# Patient Record
Sex: Male | Born: 1965 | Race: White | Hispanic: No | Marital: Married | Smoking: Former smoker
Health system: Southern US, Community
[De-identification: ages and names within clinical notes are randomized; demographics above are authoritative.]

## PROBLEM LIST (undated history)

## (undated) DIAGNOSIS — E785 Hyperlipidemia, unspecified: Secondary | ICD-10-CM

## (undated) DIAGNOSIS — I10 Essential (primary) hypertension: Secondary | ICD-10-CM

## (undated) DIAGNOSIS — G4733 Obstructive sleep apnea (adult) (pediatric): Secondary | ICD-10-CM

## (undated) DIAGNOSIS — R7989 Other specified abnormal findings of blood chemistry: Secondary | ICD-10-CM

## (undated) DIAGNOSIS — Z9989 Dependence on other enabling machines and devices: Secondary | ICD-10-CM

## (undated) HISTORY — DX: Obstructive sleep apnea (adult) (pediatric): G47.33

## (undated) HISTORY — DX: Other specified abnormal findings of blood chemistry: R79.89

## (undated) HISTORY — DX: Essential (primary) hypertension: I10

## (undated) HISTORY — DX: Hyperlipidemia, unspecified: E78.5

## (undated) HISTORY — DX: Obstructive sleep apnea (adult) (pediatric): Z99.89

---

## 2016-11-01 ENCOUNTER — Other Ambulatory Visit: Payer: Self-pay | Admitting: Family Medicine

## 2016-11-01 DIAGNOSIS — R945 Abnormal results of liver function studies: Principal | ICD-10-CM

## 2016-11-01 DIAGNOSIS — R7989 Other specified abnormal findings of blood chemistry: Secondary | ICD-10-CM

## 2016-11-10 ENCOUNTER — Ambulatory Visit
Admission: RE | Admit: 2016-11-10 | Discharge: 2016-11-10 | Disposition: A | Discharge status: Home / Self Care | Payer: 59 | Source: Ambulatory Visit | Attending: Family Medicine | Admitting: Family Medicine

## 2016-11-10 DIAGNOSIS — R7989 Other specified abnormal findings of blood chemistry: Secondary | ICD-10-CM

## 2016-11-10 DIAGNOSIS — R945 Abnormal results of liver function studies: Principal | ICD-10-CM

## 2017-11-27 IMAGING — US US ABDOMEN COMPLETE
1 series · 14 of 25 positions shown · non-contrast
Comparison: None.

CLINICAL DATA: Elevated LFTs

EXAM:
ABDOMEN ULTRASOUND COMPLETE

[Series 1: us abdomen complete · 0.22mm/px · 14 of 85 slices shown]
[im 1/85]
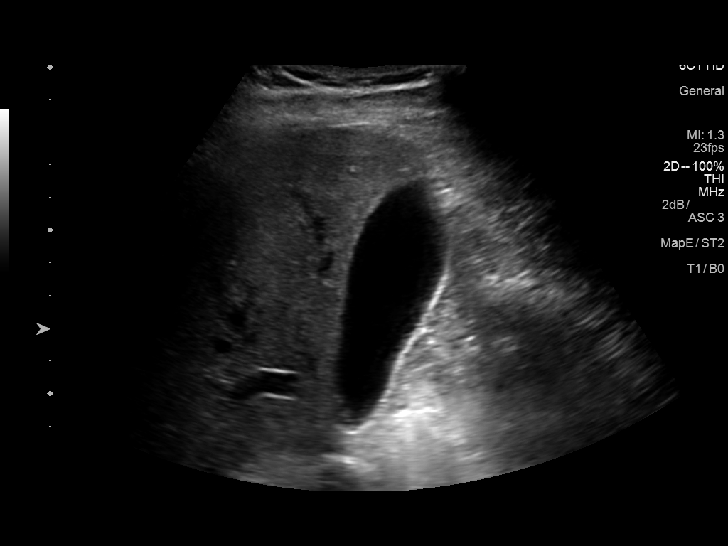
[im 8/85]
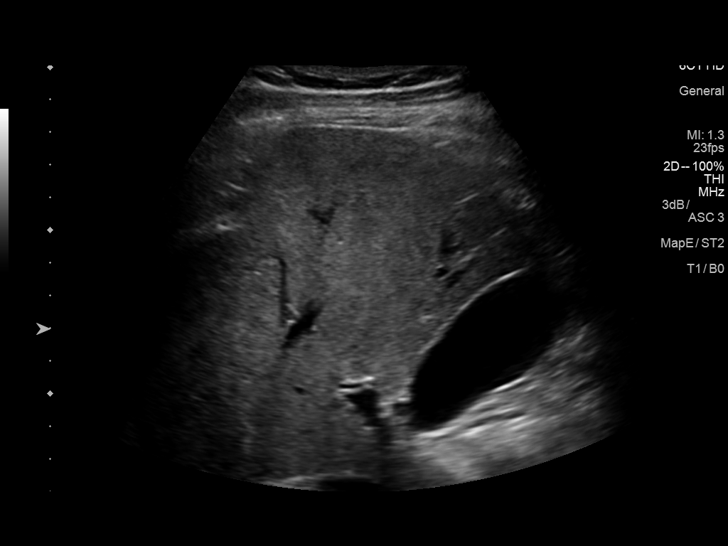
[im 15/85]
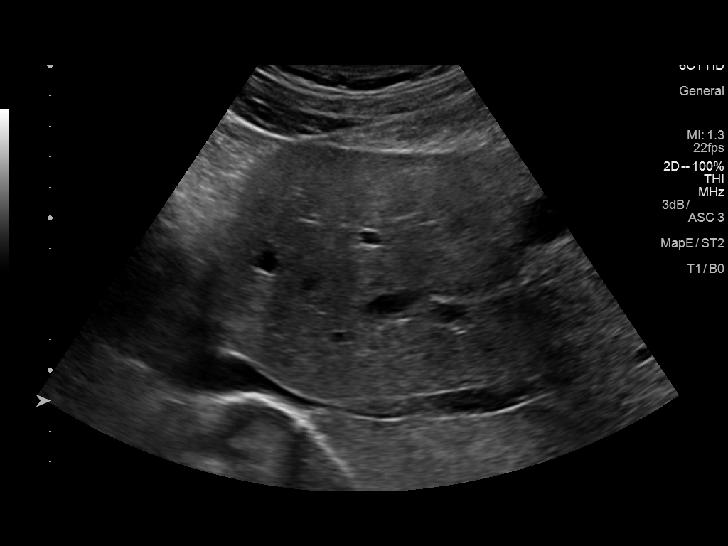
[im 22/85]
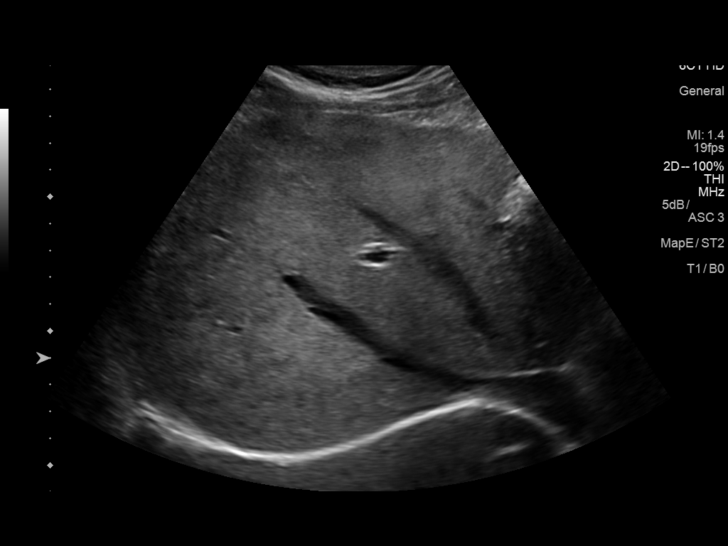
[im 29/85]
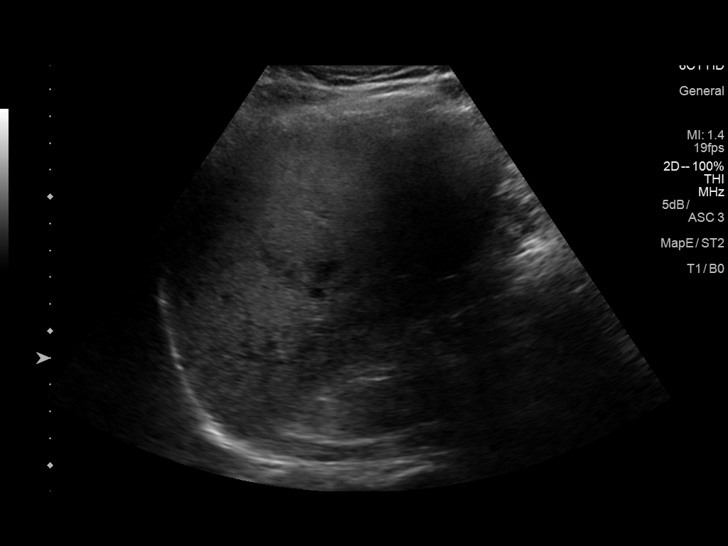
[im 32/85]
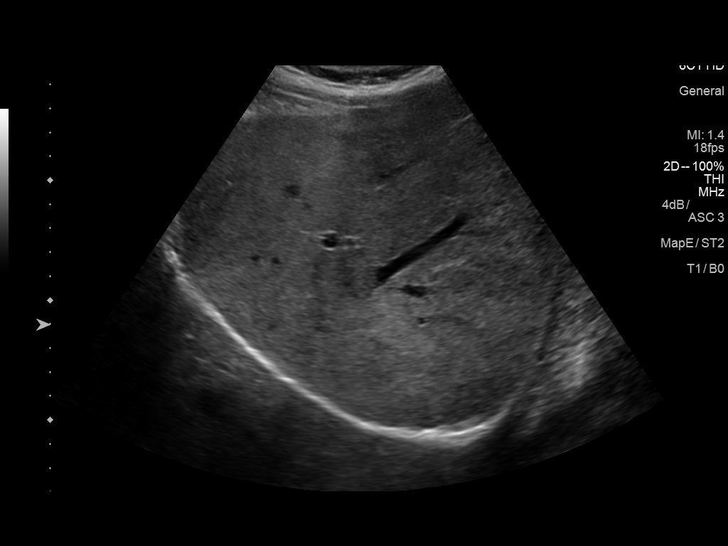
[im 39/85]
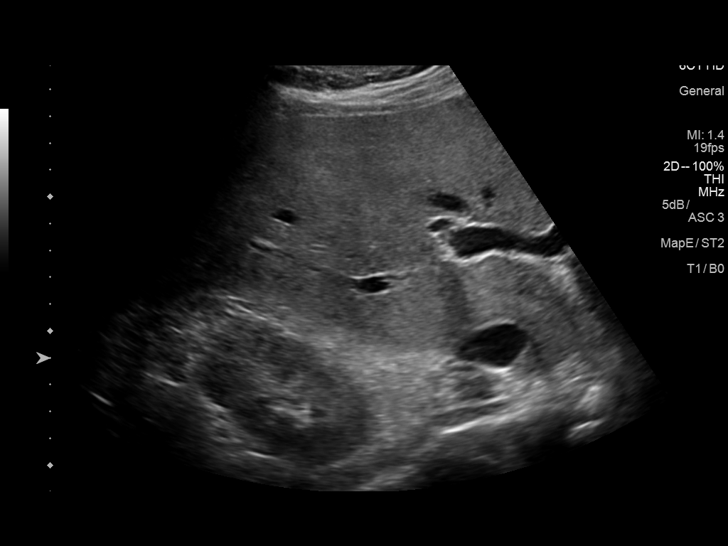
[im 46/85]
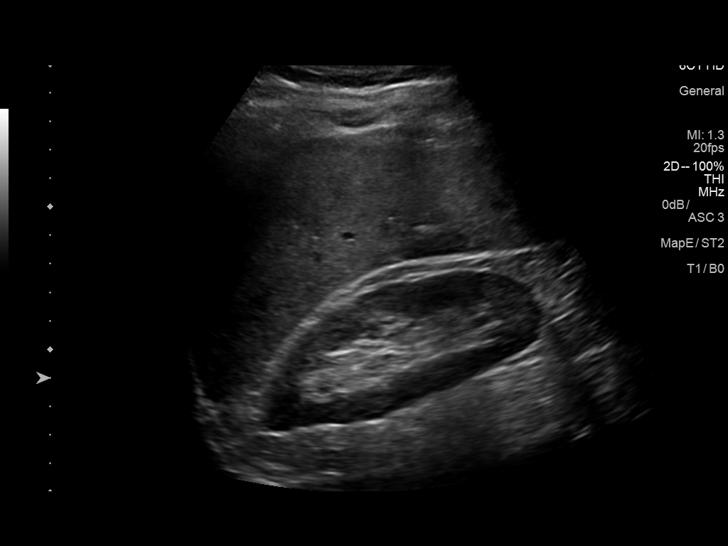
[im 53/85]
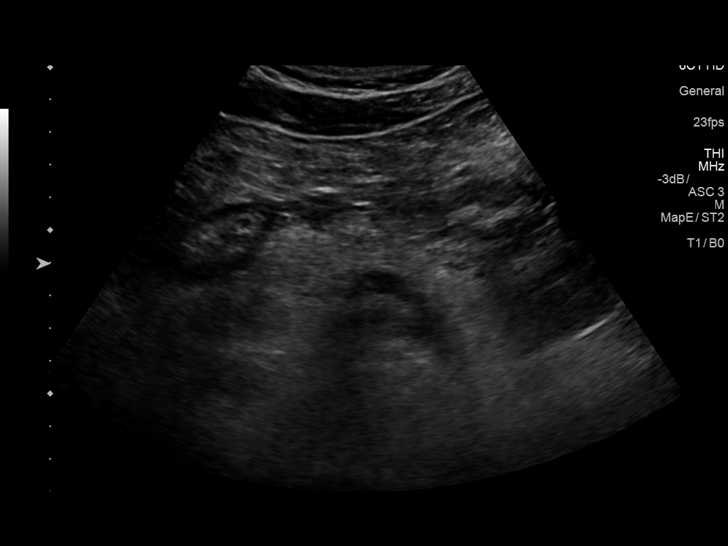
[im 57/85]
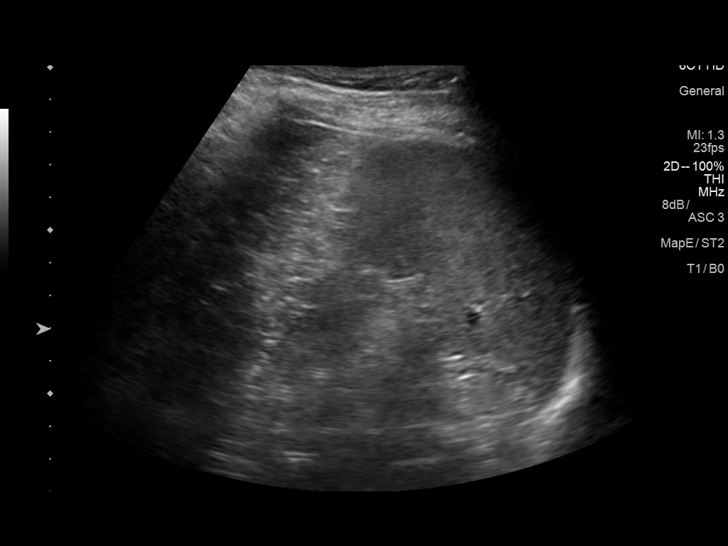
[im 64/85]
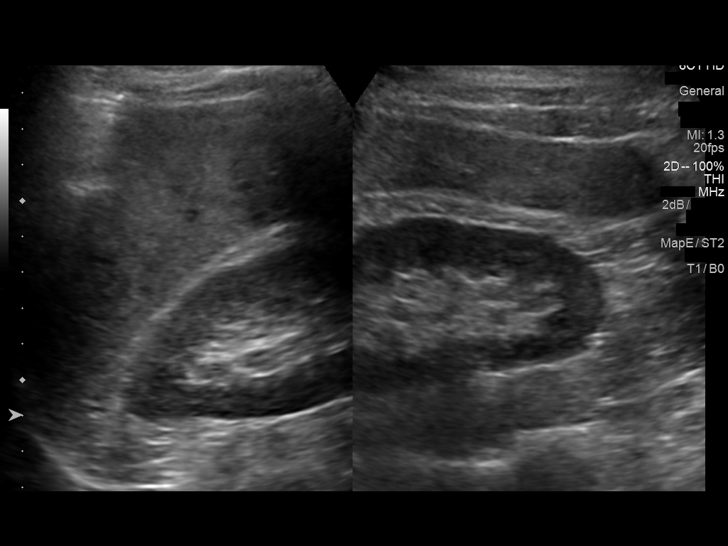
[im 71/85]
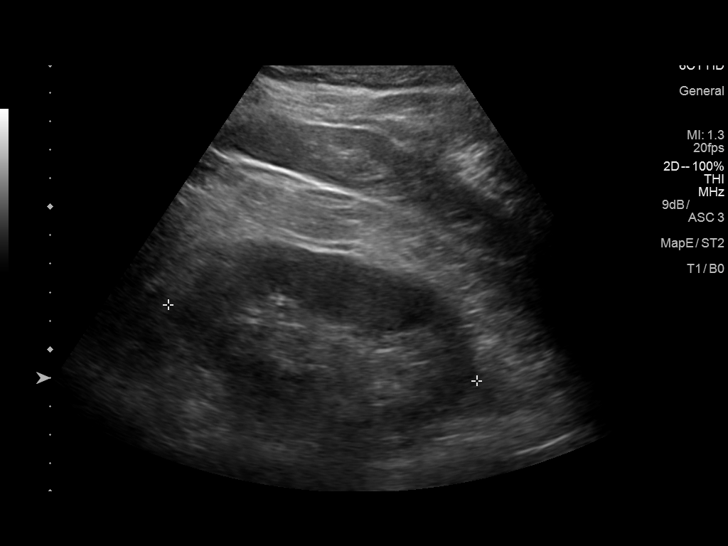
[im 78/85]
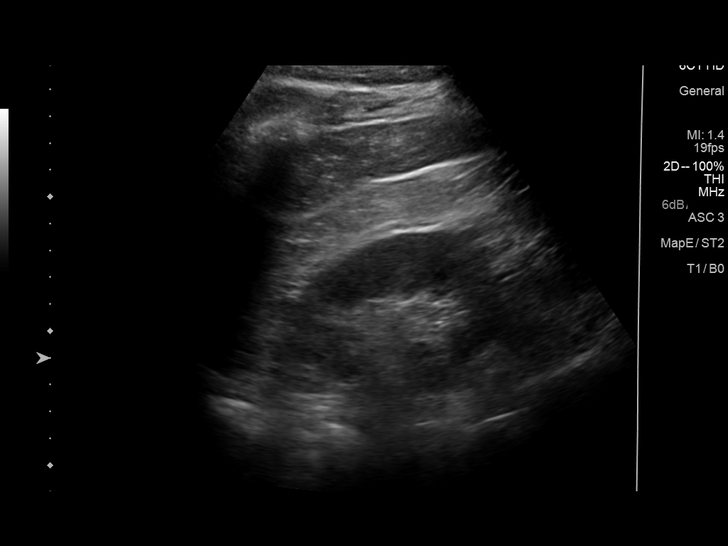
[im 85/85]
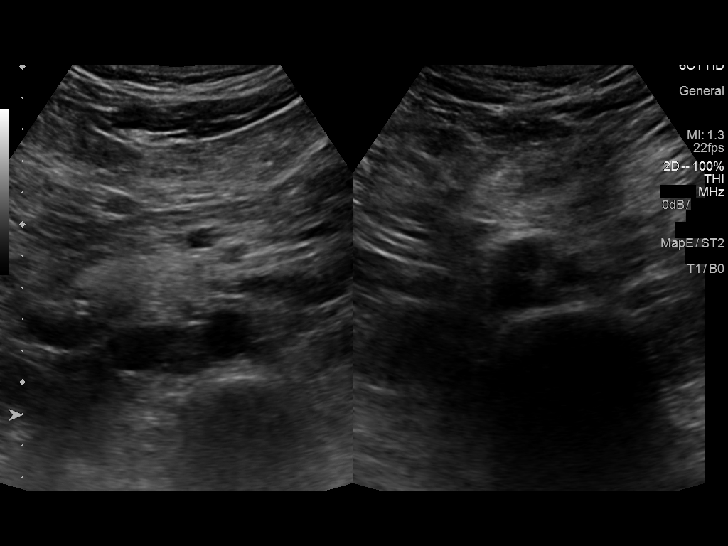

[14 of 25 positions shown; findings below may reference images not displayed]

FINDINGS: Gallbladder: No gallstones or wall thickening visualized. No
sonographic Murphy sign noted by sonographer.

Common bile duct: Diameter: 3.3 mm.

Liver: 1 cm cyst is noted in the right lobe of the liver. Liver is
otherwise within normal limits.

IVC: No abnormality visualized.

Pancreas: Somewhat limited due to overlying bowel gas.

Spleen: Size and appearance within normal limits.

Right Kidney: Length: 10.7 cm. Echogenicity within normal limits. No
mass or hydronephrosis visualized.

Left Kidney: Length: 11.1 cm. Echogenicity within normal limits. No
mass or hydronephrosis visualized.

Abdominal aorta: No aneurysm visualized.

Other findings: None.
IMPRESSION: Right hepatic cyst.  No other focal abnormality is noted.

## 2018-03-29 ENCOUNTER — Ambulatory Visit: Payer: 59 | Admitting: Podiatry

## 2018-03-29 ENCOUNTER — Encounter: Payer: Self-pay | Admitting: Podiatry

## 2018-03-29 ENCOUNTER — Ambulatory Visit (INDEPENDENT_AMBULATORY_CARE_PROVIDER_SITE_OTHER): Payer: 59

## 2018-03-29 VITALS — BP 128/80 | HR 72 | Resp 16

## 2018-03-29 DIAGNOSIS — M722 Plantar fascial fibromatosis: Secondary | ICD-10-CM

## 2018-03-29 MED ORDER — METHYLPREDNISOLONE 4 MG PO TBPK: ORAL_TABLET | ORAL | 0 refills | Status: DC

## 2018-03-29 MED ORDER — MELOXICAM 15 MG PO TABS: 15.0000 mg | ORAL_TABLET | Freq: Every day | ORAL | 3 refills | Status: DC

## 2018-03-29 NOTE — Patient Instructions (Signed)
For instructions on how to put on your Night Splint, please visit [email or web address]   Plantar Fasciitis (Heel Spur Syndrome) with Rehab The plantar fascia is a fibrous, ligament-like, soft-tissue structure that spans the bottom of the foot. Plantar fasciitis is a condition that causes pain in the foot due to inflammation of the tissue. SYMPTOMS   Pain and tenderness on the underneath side of the foot.  Pain that worsens with standing or walking. CAUSES  Plantar fasciitis is caused by irritation and injury to the plantar fascia on the underneath side of the foot. Common mechanisms of injury include:  Direct trauma to bottom of the foot.  Damage to a small nerve that runs under the foot where the main fascia attaches to the heel bone.  Stress placed on the plantar fascia due to bone spurs. RISK INCREASES WITH:   Activities that place stress on the plantar fascia (running, jumping, pivoting, or cutting).  Poor strength and flexibility.  Improperly fitted shoes.  Tight calf muscles.  Flat feet.  Failure to warm-up properly before activity.  Obesity. PREVENTION  Warm up and stretch properly before activity.  Allow for adequate recovery between workouts.  Maintain physical fitness:  Strength, flexibility, and endurance.  Cardiovascular fitness.  Maintain a health body weight.  Avoid stress on the plantar fascia.  Wear properly fitted shoes, including arch supports for individuals who have flat feet.  PROGNOSIS  If treated properly, then the symptoms of plantar fasciitis usually resolve without surgery. However, occasionally surgery is necessary.  RELATED COMPLICATIONS   Recurrent symptoms that may result in a chronic condition.  Problems of the lower back that are caused by compensating for the injury, such as limping.  Pain or weakness of the foot during push-off following surgery.  Chronic inflammation, scarring, and partial or complete fascia tear,  occurring more often from repeated injections.  TREATMENT  Treatment initially involves the use of ice and medication to help reduce pain and inflammation. The use of strengthening and stretching exercises may help reduce pain with activity, especially stretches of the Achilles tendon. These exercises may be performed at home or with a therapist. Your caregiver may recommend that you use heel cups of arch supports to help reduce stress on the plantar fascia. Occasionally, corticosteroid injections are given to reduce inflammation. If symptoms persist for greater than 6 months despite non-surgical (conservative), then surgery may be recommended.   MEDICATION   If pain medication is necessary, then nonsteroidal anti-inflammatory medications, such as aspirin and ibuprofen, or other minor pain relievers, such as acetaminophen, are often recommended.  Do not take pain medication within 7 days before surgery.  Prescription pain relievers may be given if deemed necessary by your caregiver. Use only as directed and only as much as you need.  Corticosteroid injections may be given by your caregiver. These injections should be reserved for the most serious cases, because they may only be given a certain number of times.  HEAT AND COLD  Cold treatment (icing) relieves pain and reduces inflammation. Cold treatment should be applied for 10 to 15 minutes every 2 to 3 hours for inflammation and pain and immediately after any activity that aggravates your symptoms. Use ice packs or massage the area with a piece of ice (ice massage).  Heat treatment may be used prior to performing the stretching and strengthening activities prescribed by your caregiver, physical therapist, or athletic trainer. Use a heat pack or soak the injury in warm water.  SEEK IMMEDIATE MEDICAL CARE   IF:  Treatment seems to offer no benefit, or the condition worsens.  Any medications produce adverse side effects.  EXERCISES- RANGE OF  MOTION (ROM) AND STRETCHING EXERCISES - Plantar Fasciitis (Heel Spur Syndrome) These exercises may help you when beginning to rehabilitate your injury. Your symptoms may resolve with or without further involvement from your physician, physical therapist or athletic trainer. While completing these exercises, remember:   Restoring tissue flexibility helps normal motion to return to the joints. This allows healthier, less painful movement and activity.  An effective stretch should be held for at least 30 seconds.  A stretch should never be painful. You should only feel a gentle lengthening or release in the stretched tissue.  RANGE OF MOTION - Toe Extension, Flexion  Sit with your right / left leg crossed over your opposite knee.  Grasp your toes and gently pull them back toward the top of your foot. You should feel a stretch on the bottom of your toes and/or foot.  Hold this stretch for 10 seconds.  Now, gently pull your toes toward the bottom of your foot. You should feel a stretch on the top of your toes and or foot.  Hold this stretch for 10 seconds. Repeat  times. Complete this stretch 3 times per day.   RANGE OF MOTION - Ankle Dorsiflexion, Active Assisted  Remove shoes and sit on a chair that is preferably not on a carpeted surface.  Place right / left foot under knee. Extend your opposite leg for support.  Keeping your heel down, slide your right / left foot back toward the chair until you feel a stretch at your ankle or calf. If you do not feel a stretch, slide your bottom forward to the edge of the chair, while still keeping your heel down.  Hold this stretch for 10 seconds. Repeat 3 times. Complete this stretch 2 times per day.   STRETCH  Gastroc, Standing  Place hands on wall.  Extend right / left leg, keeping the front knee somewhat bent.  Slightly point your toes inward on your back foot.  Keeping your right / left heel on the floor and your knee straight, shift  your weight toward the wall, not allowing your back to arch.  You should feel a gentle stretch in the right / left calf. Hold this position for 10 seconds. Repeat 3 times. Complete this stretch 2 times per day.  STRETCH  Soleus, Standing  Place hands on wall.  Extend right / left leg, keeping the other knee somewhat bent.  Slightly point your toes inward on your back foot.  Keep your right / left heel on the floor, bend your back knee, and slightly shift your weight over the back leg so that you feel a gentle stretch deep in your back calf.  Hold this position for 10 seconds. Repeat 3 times. Complete this stretch 2 times per day.  STRETCH  Gastrocsoleus, Standing  Note: This exercise can place a lot of stress on your foot and ankle. Please complete this exercise only if specifically instructed by your caregiver.   Place the ball of your right / left foot on a step, keeping your other foot firmly on the same step.  Hold on to the wall or a rail for balance.  Slowly lift your other foot, allowing your body weight to press your heel down over the edge of the step.  You should feel a stretch in your right / left calf.  Hold this position   for 10 seconds.  Repeat this exercise with a slight bend in your right / left knee. Repeat 3 times. Complete this stretch 2 times per day.   STRENGTHENING EXERCISES - Plantar Fasciitis (Heel Spur Syndrome)  These exercises may help you when beginning to rehabilitate your injury. They may resolve your symptoms with or without further involvement from your physician, physical therapist or athletic trainer. While completing these exercises, remember:   Muscles can gain both the endurance and the strength needed for everyday activities through controlled exercises.  Complete these exercises as instructed by your physician, physical therapist or athletic trainer. Progress the resistance and repetitions only as guided.  STRENGTH - Towel Curls  Sit in  a chair positioned on a non-carpeted surface.  Place your foot on a towel, keeping your heel on the floor.  Pull the towel toward your heel by only curling your toes. Keep your heel on the floor. Repeat 3 times. Complete this exercise 2 times per day.  STRENGTH - Ankle Inversion  Secure one end of a rubber exercise band/tubing to a fixed object (table, pole). Loop the other end around your foot just before your toes.  Place your fists between your knees. This will focus your strengthening at your ankle.  Slowly, pull your big toe up and in, making sure the band/tubing is positioned to resist the entire motion.  Hold this position for 10 seconds.  Have your muscles resist the band/tubing as it slowly pulls your foot back to the starting position. Repeat 3 times. Complete this exercises 2 times per day.  Document Released: 06/20/2005 Document Revised: 09/12/2011 Document Reviewed: 10/02/2008 Exit[employer] Patient Information 2014 [employer].  

## 2018-03-29 NOTE — Progress Notes (Signed)
  Subjective:  Patient ID: James Rowe, male    DOB: 11-01-1965,  MRN: [id number] HPI Chief Complaint  Patient presents with  . Foot Pain    Plantar heel and arch bilateral (R>L) - aching x several months, AM pain, saw ortho-recommended stretches, noticing more lateral foot pain on right, active runner and going on a big hike in June 2020  . New Patient (Initial Visit)    52 y.o. male presents with the above complaint.   ROS: Denies fever chills nausea vomiting muscle aches pains calf pain back pain chest pain shortness of breath.  No past medical history on file.   Current Outpatient Medications:  .  LISINOPRIL PO, Take by mouth., Disp: , Rfl:  .  MINOCYCLINE HCL PO, Take by mouth., Disp: , Rfl:  .  meloxicam (MOBIC) 15 MG tablet, Take 1 tablet (15 mg total) by mouth daily., Disp: 30 tablet, Rfl: 3 .  methylPREDNISolone (MEDROL DOSEPAK) 4 MG TBPK tablet, 6 day dose pack - take as directed, Disp: 21 tablet, Rfl: 0  No Known Allergies Review of Systems Objective:   Vitals:   03/29/18 0855  BP: 128/80  Pulse: 72  Resp: 16    General: Well developed, nourished, in no acute distress, alert and oriented x3   Dermatological: Skin is warm, dry and supple bilateral. Nails x 10 are well maintained; remaining integument appears unremarkable at this time. There are no open sores, no preulcerative lesions, no rash or signs of infection present.  Vascular: Dorsalis Pedis artery and Posterior Tibial artery pedal pulses are 2/4 bilateral with immedate capillary fill time. Pedal hair growth present. No varicosities and no lower extremity edema present bilateral.   Neruologic: Grossly intact via light touch bilateral. Vibratory intact via tuning fork bilateral. Protective threshold with Semmes Wienstein monofilament intact to all pedal sites bilateral. Patellar and Achilles deep tendon reflexes 2+ bilateral. No Babinski or clonus noted bilateral.   Musculoskeletal: No gross boney pedal  deformities bilateral. No pain, crepitus, or limitation noted with foot and ankle range of motion bilateral. Muscular strength 5/5 in all groups tested bilateral.  Gait: Unassisted, Nonantalgic.    Radiographs:  Radiographs taken today demonstrate plantar distally oriented calcaneal heel spur of the left foot but minimally so on the right foot.  No retrocalcaneal issues.  Soft tissue increase in density plantar fashion calcaneal insertion sites bilateral right appears to be worse than that of the left.  No acute findings are noted.  Assessment & Plan:   Assessment: Plantar fasciitis bilateral forefoot compensation and rear foot and lateral compensation.    Plan: Discussed etiology pathology conservative surgical therapies at this point time I injected bilateral heels with 20 mg Kenalog 5 mg Marcaine and sterile Betadine skin prep I also started him on Medrol Dosepak to be followed by meloxicam.  Placed plantar fascial brace bilaterally and a single night splint.  Discussed appropriate shoe gear stretching exercises ice therapy sugar modifications.     Rayonna Heldman T. [place], [place]

## 2018-05-03 ENCOUNTER — Ambulatory Visit: Payer: 59 | Admitting: Podiatry

## 2018-05-03 ENCOUNTER — Encounter: Payer: Self-pay | Admitting: Podiatry

## 2018-05-03 DIAGNOSIS — M722 Plantar fascial fibromatosis: Secondary | ICD-10-CM

## 2018-05-03 MED ORDER — MELOXICAM 15 MG PO TABS: 15.0000 mg | ORAL_TABLET | Freq: Every day | ORAL | 3 refills | Status: DC

## 2018-05-03 NOTE — Progress Notes (Signed)
He presents today for follow-up of his bilateral heels.  States they are much better he is very happy that they are much improved.  Objective: Vital signs are stable he is alert and oriented x3.  Pulses are palpable.  He has no pain on palpation medial calcaneal tubercles bilateral.  Assessment: Well-healing plantar fasciitis bilateral.  Plan: Discussed etiology pathology conservative surgical therapies at this point I have requested he continue all conservative therapies including plantar fascial brace his night splints oral anti-inflammatories and shoe gear modifications.  I will follow-up with him in 1 month if necessary.

## 2021-09-16 ENCOUNTER — Telehealth: Payer: Self-pay

## 2021-09-16 NOTE — Telephone Encounter (Signed)
NOTES SCANNED TO REFERRAL 

## 2021-12-31 ENCOUNTER — Telehealth (HOSPITAL_BASED_OUTPATIENT_CLINIC_OR_DEPARTMENT_OTHER): Payer: Self-pay | Admitting: Internal Medicine

## 2021-12-31 ENCOUNTER — Ambulatory Visit (INDEPENDENT_AMBULATORY_CARE_PROVIDER_SITE_OTHER): Payer: 59 | Admitting: Internal Medicine

## 2021-12-31 ENCOUNTER — Encounter (HOSPITAL_BASED_OUTPATIENT_CLINIC_OR_DEPARTMENT_OTHER): Payer: Self-pay | Admitting: Internal Medicine

## 2021-12-31 VITALS — BP 118/79 | HR 75 | Ht 69.0 in | Wt 180.3 lb

## 2021-12-31 DIAGNOSIS — E785 Hyperlipidemia, unspecified: Secondary | ICD-10-CM

## 2021-12-31 DIAGNOSIS — I10 Essential (primary) hypertension: Secondary | ICD-10-CM

## 2021-12-31 DIAGNOSIS — R7989 Other specified abnormal findings of blood chemistry: Secondary | ICD-10-CM | POA: Diagnosis not present

## 2021-12-31 DIAGNOSIS — Z9989 Dependence on other enabling machines and devices: Secondary | ICD-10-CM

## 2021-12-31 DIAGNOSIS — G4733 Obstructive sleep apnea (adult) (pediatric): Secondary | ICD-10-CM | POA: Diagnosis not present

## 2021-12-31 NOTE — Patient Instructions (Signed)
Medication Instructions:  Your physician recommends that you continue on your current medications as directed. Please refer to the Current Medication list given to you today.  *If you need a refill on your cardiac medications before your next appointment, please call your pharmacy*   Lab Work: FASTING lab work to check cholesterol in about 4 months   If you have labs (blood work) drawn today and your tests are completely normal, you will receive your results only by: MyChart Message (if you have MyChart) OR A paper copy in the mail If you have any lab test that is abnormal or we need to change your treatment, we will call you to review the results.   Testing/Procedures: Dr. Rennis Golden has ordered a CT coronary calcium score.   Test locations:  [facility]   This is $99 out of pocket.   Coronary CalciumScan A coronary calcium scan is an imaging test used to look for deposits of calcium and other fatty materials (plaques) in the inner lining of the blood vessels of the heart (coronary arteries). These deposits of calcium and plaques can partly clog and narrow the coronary arteries without producing any symptoms or warning signs. This puts a person at risk for a heart attack. This test can detect these deposits before symptoms develop. Tell a health care provider about: Any allergies you have. All medicines you are taking, including vitamins, herbs, eye drops, creams, and over-the-counter medicines. Any problems you or family members have had with anesthetic medicines. Any blood disorders you have. Any surgeries you have had. Any medical conditions you have. Whether you are pregnant or may be pregnant. What are the risks? Generally, this is a safe procedure. However, problems may occur, including: Harm to a pregnant woman and her unborn baby. This test involves the use of radiation. Radiation exposure can be dangerous to a pregnant woman and her unborn baby.  If you are pregnant, you generally should not have this procedure done. Slight increase in the risk of cancer. This is because of the radiation involved in the test. What happens before the procedure? No preparation is needed for this procedure. What happens during the procedure? You will undress and remove any jewelry around your neck or chest. You will put on a hospital gown. Sticky electrodes will be placed on your chest. The electrodes will be connected to an electrocardiogram (ECG) machine to record a tracing of the electrical activity of your heart. A CT scanner will take pictures of your heart. During this time, you will be asked to lie still and hold your breath for 2-3 seconds while a picture of your heart is being taken. The procedure may vary among health care providers and hospitals. What happens after the procedure? You can get dressed. You can return to your normal activities. It is up to you to get the results of your test. Ask your health care provider, or the department that is doing the test, when your results will be ready. Summary A coronary calcium scan is an imaging test used to look for deposits of calcium and other fatty materials (plaques) in the inner lining of the blood vessels of the heart (coronary arteries). Generally, this is a safe procedure. Tell your health care provider if you are pregnant or may be pregnant. No preparation is needed for this procedure. A CT scanner will take pictures of your heart. You can return to your normal activities after the scan is done. This information is not intended  to replace advice given to you by your health care provider. Make sure you discuss any questions you have with your health care provider. Document Released: 12/17/2007 Document Revised: 05/09/2016 Document Reviewed: 05/09/2016 Elsevier Interactive Patient Education  2017 [employer].    Follow-Up: At [facility], you and your health needs are our priority.   As part of our continuing mission to provide you with exceptional heart care, we have created designated Provider Care Teams.  These Care Teams include your primary Cardiologist (physician) and Advanced Practice Providers (APPs -  Physician Assistants and Nurse Practitioners) who all work together to provide you with the care you need, when you need it.  We recommend signing up for the patient portal called "MyChart".  Sign up information is provided on this After Visit Summary.  MyChart is used to connect with patients for Virtual Visits (Telemedicine).  Patients are able to view lab/test results, encounter notes, upcoming appointments, etc.  Non-urgent messages can be sent to your provider as well.   To learn more about what you can do with MyChart, go to [email or web address].    Your next appointment:   4 month(s)  The format for your next appointment:   In Person  Provider:   Zoila Shutter MD - lipid clinic

## 2021-12-31 NOTE — Progress Notes (Signed)
LIPID CLINIC CONSULT NOTE  Chief Complaint:  Manage dyslipidemia  Primary Care Physician: Juluis Rainier, MD (Inactive)  Primary Cardiologist:  None  HPI:  James Rowe is a 56 y.o. male who is being seen today for the evaluation of dyslipidemia at the request of Jarrett Soho, [place].  This is a pleasant 56 year old male kindly referred for evaluation management of dyslipidemia.  He reports longstanding elevation in his cholesterol despite a marked improvement in his diet and regular physical exercise.  He does have a history of low testosterone but not on treatment, hypertension and obstructive sleep apnea on CPAP for which she is compliant.  He has considered going on therapy for his cholesterol before but was interested in knowing more about his cardiovascular risk.  Particularly he had inquired about a calcium score.  This was discussed with his primary provider and he was referred for further evaluation.  He does note multiple family members including his mother, sister and brother all who have high cholesterol.  He denies any chest pain or shortness of breath.  He reports a recent trip out west for which she did a lot of hiking and it was asymptomatic.  He works in Airline [employer].  PMHx:  Past Medical History:  Diagnosis Date   Dyslipidemia    Hypertension    Low testosterone    OSA on CPAP    FAMHx:  Family History  Problem Relation Age of Onset   Hyperlipidemia Mother    Hyperlipidemia Sister    Hyperlipidemia Brother     SOCHx:   reports that he has quit smoking. He has never used smokeless tobacco. He reports current alcohol use. No history on file for drug use.  ALLERGIES:  No Known Allergies  ROS: Pertinent items noted in HPI and remainder of comprehensive ROS otherwise negative.  HOME MEDS: Current Outpatient Medications on File Prior to Visit  Medication Sig Dispense Refill   cetirizine (ZYRTEC ALLERGY) 10 MG tablet as needed.     lisinopril (ZESTRIL) 5  MG tablet Take 1 tablet by mouth daily.     tadalafil (CIALIS) 5 MG tablet 1 tablet as needed     No current facility-administered medications on file prior to visit.    LABS/IMAGING: No results found for this or any previous visit (from the past 48 hour(s)). No results found.  LIPID PANEL: No results found for: "CHOL", "TRIG", "HDL", "CHOLHDL", "VLDL", "LDLCALC", "LDLDIRECT"  WEIGHTS: Wt Readings from Last 3 Encounters:  12/31/21 180 lb 4.8 oz (81.8 kg)    VITALS: BP 118/79   Pulse 75   Ht 5\' 9"  (1.753 m)   Wt 180 lb 4.8 oz (81.8 kg)   SpO2 97%   BMI 26.63 kg/m   EXAM: General appearance: alert and no distress Neck: no carotid bruit, no JVD, and thyroid not enlarged, symmetric, no tenderness/mass/nodules Lungs: clear to auscultation bilaterally Heart: regular rate and rhythm Abdomen: soft, non-tender; bowel sounds normal; no masses,  no organomegaly Extremities: extremities normal, atraumatic, no cyanosis or edema Pulses: 2+ and symmetric Skin: Skin color, texture, turgor normal. No rashes or lesions Neurologic: Grossly normal Psych: Pleasant  EKG: N/A  ASSESSMENT: Mixed dyslipidemia History of low testosterone OSA on CPAP Hypertension  PLAN: 1.   Mr. Reeder has a mixed dyslipidemia but has not been able to get his cholesterol much lower than 150 for the LDL.  I suspect this is primarily genetic dyslipidemia with multiple family members that have high cholesterol however there is no report  of early onset heart disease.  I think is very reasonable to get a calcium score to further restratify him but ultimately he will likely need therapy.  If his calcium score is very high, then we would consider more aggressive therapy to target LDL less than 70 but otherwise likely to try to target his LDL less than 100 and we may be able to use a lower dose of a potent statin such as rosuvastatin for that.  He is agreeable to this approach.  I will contact him with the results of  his calcium score and we will plan repeat lipids and an LP(a) on therapy in about 3 to 4 months and follow-up with me at that time.  Thanks again for the kind referral.  Chrystie Nose, MD, [facility]    [employer] [facility]  Medical Director of the [facility] Lipid [facility] &  Cardiovascular Risk Reduction Clinic Diplomate of the American Board of Clinical Lipidology Attending Cardiologist  Direct Dial: [phone]  Fax: [phone]  Website:  [email or web address] 12/31/2021, 12:27 PM

## 2021-12-31 NOTE — Telephone Encounter (Signed)
Spoke with patient regarding the Tuesday 01/18/22 9:30 am calcium scoring at [facility]---arrival time is 9:15 am for check in ----patient voiced his understanding.

## 2022-01-11 ENCOUNTER — Other Ambulatory Visit (HOSPITAL_BASED_OUTPATIENT_CLINIC_OR_DEPARTMENT_OTHER): Payer: 59

## 2022-01-18 ENCOUNTER — Ambulatory Visit (HOSPITAL_BASED_OUTPATIENT_CLINIC_OR_DEPARTMENT_OTHER)
Admission: RE | Admit: 2022-01-18 | Discharge: 2022-01-18 | Disposition: A | Discharge status: Home / Self Care | Payer: 59 | Source: Ambulatory Visit | Attending: Internal Medicine | Admitting: Internal Medicine

## 2022-01-18 ENCOUNTER — Encounter (HOSPITAL_BASED_OUTPATIENT_CLINIC_OR_DEPARTMENT_OTHER): Payer: Self-pay

## 2022-01-18 DIAGNOSIS — E785 Hyperlipidemia, unspecified: Secondary | ICD-10-CM | POA: Insufficient documentation

## 2022-01-21 ENCOUNTER — Encounter (HOSPITAL_BASED_OUTPATIENT_CLINIC_OR_DEPARTMENT_OTHER): Payer: Self-pay | Admitting: Internal Medicine

## 2022-01-25 ENCOUNTER — Other Ambulatory Visit: Payer: Self-pay | Admitting: *Deleted

## 2022-01-25 MED ORDER — ROSUVASTATIN CALCIUM 5 MG PO TABS: 5.0000 mg | ORAL_TABLET | Freq: Every day | ORAL | 3 refills | Status: DC

## 2022-03-08 ENCOUNTER — Ambulatory Visit (HOSPITAL_BASED_OUTPATIENT_CLINIC_OR_DEPARTMENT_OTHER): Payer: 59 | Admitting: Internal Medicine

## 2022-04-02 LAB — NMR, LIPOPROFILE
Cholesterol, Total: 189 mg/dL (ref 100–199)
HDL Particle Number: 45.4 umol/L (ref >=30.5)
HDL-C: 72 mg/dL (ref >39)
LDL Particle Number: 1233 nmol/L — ABNORMAL HIGH (ref <1000)
LDL Size: 21.1 nm (ref >20.5)
LDL-C (NIH Calc): 105 mg/dL — ABNORMAL HIGH (ref 0–99)
LP-IR Score: 27 (ref <=45)
Small LDL Particle Number: 411 nmol/L (ref <=527)
Triglycerides: 65 mg/dL (ref 0–149)

## 2022-04-02 LAB — LIPOPROTEIN A (LPA): Lipoprotein (a): <8.4 nmol/L (ref <75.0)

## 2022-04-12 ENCOUNTER — Encounter (HOSPITAL_BASED_OUTPATIENT_CLINIC_OR_DEPARTMENT_OTHER): Payer: Self-pay | Admitting: Internal Medicine

## 2022-04-12 ENCOUNTER — Ambulatory Visit (INDEPENDENT_AMBULATORY_CARE_PROVIDER_SITE_OTHER): Payer: 59 | Admitting: Internal Medicine

## 2022-04-12 VITALS — BP 120/76 | HR 68 | Ht 69.0 in | Wt 182.3 lb

## 2022-04-12 DIAGNOSIS — I10 Essential (primary) hypertension: Secondary | ICD-10-CM | POA: Diagnosis not present

## 2022-04-12 DIAGNOSIS — E785 Hyperlipidemia, unspecified: Secondary | ICD-10-CM | POA: Diagnosis not present

## 2022-04-12 DIAGNOSIS — R7989 Other specified abnormal findings of blood chemistry: Secondary | ICD-10-CM | POA: Diagnosis not present

## 2022-04-12 NOTE — Patient Instructions (Signed)
Medication Instructions:  Your Physician recommend you continue on your current medication as directed.    *If you need a refill on your cardiac medications before your next appointment, please call your pharmacy*   Lab Work: None ordered today   Testing/Procedures: None ordered today   Follow-Up: At [facility], you and your health needs are our priority.  As part of our continuing mission to provide you with exceptional heart care, we have created designated Provider Care Teams.  These Care Teams include your primary Cardiologist (physician) and Advanced Practice Providers (APPs -  Physician Assistants and Nurse Practitioners) who all work together to provide you with the care you need, when you need it.  We recommend signing up for the patient portal called "MyChart".  Sign up information is provided on this After Visit Summary.  MyChart is used to connect with patients for Virtual Visits (Telemedicine).  Patients are able to view lab/test results, encounter notes, upcoming appointments, etc.  Non-urgent messages can be sent to your provider as well.   To learn more about what you can do with MyChart, go to [email or web address].    Your next appointment:   As needed  The format for your next appointment:   In Person  Provider:   K. Chad Hilty, MD          

## 2022-04-12 NOTE — Progress Notes (Signed)
LIPID CLINIC CONSULT NOTE  Chief Complaint:  Manage dyslipidemia  Primary Care Physician: Jarrett Soho, PA-C  Primary Cardiologist:  None  HPI:  James Rowe is a 56 y.o. male who is being seen today for the evaluation of dyslipidemia at the request of No ref. provider found.  This is a pleasant 56 year old male kindly referred for evaluation management of dyslipidemia.  He reports longstanding elevation in his cholesterol despite a marked improvement in his diet and regular physical exercise.  He does have a history of low testosterone but not on treatment, hypertension and obstructive sleep apnea on CPAP for which she is compliant.  He has considered going on therapy for his cholesterol before but was interested in knowing more about his cardiovascular risk.  Particularly he had inquired about a calcium score.  This was discussed with his primary provider and he was referred for further evaluation.  He does note multiple family members including his mother, sister and brother all who have high cholesterol.  He denies any chest pain or shortness of breath.  He reports a recent trip [place] for which she did a lot of hiking and it was asymptomatic.  He works in Air[employer].  04/12/2022  James Rowe is seen today in follow-up.  He is doing very well on low-dose rosuvastatin.  His calcium score was 0 but due to his risk factors I recommended LDL less than 100.  After starting low-dose rosuvastatin his LDL now is 105, particle #[id number], HDL 72, triglycerides 65.  Small LDL particle number is 411.  His LP(a) was negative.  Overall this is very favorable.  I suspect his HDL is cardioprotective.  I would recommend staying on the low-dose of rosuvastatin and will continue to work on diet and exercise which I think will allow him to achieve a target LDL of 100 or lower.  PMHx:  Past Medical History:  Diagnosis Date   Dyslipidemia    Hypertension    Low testosterone    OSA on CPAP    FAMHx:   Family History  Problem Relation Age of Onset   Hyperlipidemia Mother    Hyperlipidemia Sister    Hyperlipidemia Brother     SOCHx:   reports that he has quit smoking. He has never used smokeless tobacco. He reports current alcohol use. No history on file for drug use.  ALLERGIES:  No Known Allergies  ROS: Pertinent items noted in HPI and remainder of comprehensive ROS otherwise negative.  HOME MEDS: Current Outpatient Medications on File Prior to Visit  Medication Sig Dispense Refill   cetirizine (ZYRTEC ALLERGY) 10 MG tablet as needed.     lisinopril (ZESTRIL) 5 MG tablet Take 1 tablet by mouth daily.     rosuvastatin (CRESTOR) 5 MG tablet Take 1 tablet (5 mg total) by mouth daily. 90 tablet 3   tadalafil (CIALIS) 5 MG tablet 1 tablet as needed     No current facility-administered medications on file prior to visit.    LABS/IMAGING: No results found for this or any previous visit (from the past 48 hour(s)). No results found.  LIPID PANEL: No results found for: "CHOL", "TRIG", "HDL", "CHOLHDL", "VLDL", "LDLCALC", "LDLDIRECT"  WEIGHTS: Wt Readings from Last 3 Encounters:  04/12/22 182 lb 4.8 oz (82.7 kg)  12/31/21 180 lb 4.8 oz (81.8 kg)    VITALS: BP 120/76   Pulse 68   Ht 5\' 9"  (1.753 m)   Wt 182 lb 4.8 oz (82.7 kg)   SpO2  97%   BMI 26.92 kg/m   EXAM: General appearance: alert and no distress Neck: no carotid bruit, no JVD, and thyroid not enlarged, symmetric, no tenderness/mass/nodules Lungs: clear to auscultation bilaterally Heart: regular rate and rhythm Abdomen: soft, non-tender; bowel sounds normal; no masses,  no organomegaly Extremities: extremities normal, atraumatic, no cyanosis or edema Pulses: 2+ and symmetric Skin: Skin color, texture, turgor normal. No rashes or lesions Neurologic: Grossly normal Psych: Pleasant  EKG: N/A  ASSESSMENT: Mixed dyslipidemia History of low testosterone OSA on CPAP Hypertension 0 CAC score  (01/2022)  PLAN: 1.   James Rowe has done well on low-dose rosuvastatin.  He is very close to target LDL less than 100.  No coronary calcium which is reassuring.  I would advise remaining on low-dose rosuvastatin.  He could have this hopefully prescribed by his PCP and his lipids followed there.  I am happy to see him back on an as-needed basis.  Fortunately there are no significant high risk features.  Thanks again for the kind referral.  Pixie Casino, MD, FACC, [place] Director of the [facility]  [facility] Diplomate of the American Board of Clinical Lipidology Attending Cardiologist  Direct Dial: [phone]  Fax: [phone]  Website:  [email or web address] 04/12/2022, 8:43 AM

## 2022-08-05 DIAGNOSIS — D225 Melanocytic nevi of trunk: Secondary | ICD-10-CM | POA: Diagnosis not present

## 2022-08-05 DIAGNOSIS — Z08 Encounter for follow-up examination after completed treatment for malignant neoplasm: Secondary | ICD-10-CM | POA: Diagnosis not present

## 2022-08-05 DIAGNOSIS — D485 Neoplasm of uncertain behavior of skin: Secondary | ICD-10-CM | POA: Diagnosis not present

## 2022-08-05 DIAGNOSIS — Z1283 Encounter for screening for malignant neoplasm of skin: Secondary | ICD-10-CM | POA: Diagnosis not present

## 2022-08-05 DIAGNOSIS — Z85820 Personal history of malignant melanoma of skin: Secondary | ICD-10-CM | POA: Diagnosis not present

## 2022-10-12 DIAGNOSIS — G4733 Obstructive sleep apnea (adult) (pediatric): Secondary | ICD-10-CM | POA: Diagnosis not present

## 2022-10-14 DIAGNOSIS — E7800 Pure hypercholesterolemia, unspecified: Secondary | ICD-10-CM | POA: Diagnosis not present

## 2022-10-14 DIAGNOSIS — I10 Essential (primary) hypertension: Secondary | ICD-10-CM | POA: Diagnosis not present

## 2022-10-14 DIAGNOSIS — Z0000 Encounter for general adult medical examination without abnormal findings: Secondary | ICD-10-CM | POA: Diagnosis not present

## 2022-11-04 DIAGNOSIS — Z85820 Personal history of malignant melanoma of skin: Secondary | ICD-10-CM | POA: Diagnosis not present

## 2022-11-04 DIAGNOSIS — L570 Actinic keratosis: Secondary | ICD-10-CM | POA: Diagnosis not present

## 2022-11-04 DIAGNOSIS — Z1283 Encounter for screening for malignant neoplasm of skin: Secondary | ICD-10-CM | POA: Diagnosis not present

## 2022-11-04 DIAGNOSIS — X32XXXD Exposure to sunlight, subsequent encounter: Secondary | ICD-10-CM | POA: Diagnosis not present

## 2022-11-04 DIAGNOSIS — D225 Melanocytic nevi of trunk: Secondary | ICD-10-CM | POA: Diagnosis not present

## 2022-11-04 DIAGNOSIS — Z08 Encounter for follow-up examination after completed treatment for malignant neoplasm: Secondary | ICD-10-CM | POA: Diagnosis not present

## 2022-11-11 DIAGNOSIS — G4733 Obstructive sleep apnea (adult) (pediatric): Secondary | ICD-10-CM | POA: Diagnosis not present

## 2022-12-12 DIAGNOSIS — G4733 Obstructive sleep apnea (adult) (pediatric): Secondary | ICD-10-CM | POA: Diagnosis not present

## 2023-01-26 ENCOUNTER — Other Ambulatory Visit: Payer: Self-pay | Admitting: Internal Medicine

## 2023-01-27 DIAGNOSIS — D225 Melanocytic nevi of trunk: Secondary | ICD-10-CM | POA: Diagnosis not present

## 2023-01-27 DIAGNOSIS — D2272 Melanocytic nevi of left lower limb, including hip: Secondary | ICD-10-CM | POA: Diagnosis not present

## 2023-01-27 DIAGNOSIS — X32XXXD Exposure to sunlight, subsequent encounter: Secondary | ICD-10-CM | POA: Diagnosis not present

## 2023-01-27 DIAGNOSIS — L570 Actinic keratosis: Secondary | ICD-10-CM | POA: Diagnosis not present

## 2023-01-27 DIAGNOSIS — Z1283 Encounter for screening for malignant neoplasm of skin: Secondary | ICD-10-CM | POA: Diagnosis not present

## 2023-01-27 DIAGNOSIS — D485 Neoplasm of uncertain behavior of skin: Secondary | ICD-10-CM | POA: Diagnosis not present

## 2023-02-28 DIAGNOSIS — G4733 Obstructive sleep apnea (adult) (pediatric): Secondary | ICD-10-CM | POA: Diagnosis not present

## 2023-03-28 DIAGNOSIS — G4733 Obstructive sleep apnea (adult) (pediatric): Secondary | ICD-10-CM | POA: Diagnosis not present

## 2023-03-31 DIAGNOSIS — G4733 Obstructive sleep apnea (adult) (pediatric): Secondary | ICD-10-CM | POA: Diagnosis not present

## 2023-04-11 DIAGNOSIS — G4733 Obstructive sleep apnea (adult) (pediatric): Secondary | ICD-10-CM | POA: Diagnosis not present

## 2023-04-23 ENCOUNTER — Other Ambulatory Visit: Payer: Self-pay | Admitting: Internal Medicine

## 2023-04-30 DIAGNOSIS — G4733 Obstructive sleep apnea (adult) (pediatric): Secondary | ICD-10-CM | POA: Diagnosis not present

## 2023-05-12 DIAGNOSIS — G4733 Obstructive sleep apnea (adult) (pediatric): Secondary | ICD-10-CM | POA: Diagnosis not present

## 2023-05-18 DIAGNOSIS — M47816 Spondylosis without myelopathy or radiculopathy, lumbar region: Secondary | ICD-10-CM | POA: Diagnosis not present

## 2023-05-19 ENCOUNTER — Encounter (HOSPITAL_COMMUNITY): Payer: Self-pay | Admitting: Emergency Medicine

## 2023-05-19 ENCOUNTER — Other Ambulatory Visit: Payer: Self-pay

## 2023-05-19 ENCOUNTER — Emergency Department (HOSPITAL_COMMUNITY)
Admission: EM | Admit: 2023-05-19 | Discharge: 2023-05-19 | Disposition: A | Discharge status: Home / Self Care | Payer: BC Managed Care – PPO

## 2023-05-19 DIAGNOSIS — T481X1A Poisoning by skeletal muscle relaxants [neuromuscular blocking agents], accidental (unintentional), initial encounter: Secondary | ICD-10-CM | POA: Diagnosis not present

## 2023-05-19 DIAGNOSIS — Z79899 Other long term (current) drug therapy: Secondary | ICD-10-CM | POA: Diagnosis not present

## 2023-05-19 DIAGNOSIS — T50901A Poisoning by unspecified drugs, medicaments and biological substances, accidental (unintentional), initial encounter: Secondary | ICD-10-CM

## 2023-05-19 DIAGNOSIS — I10 Essential (primary) hypertension: Secondary | ICD-10-CM | POA: Insufficient documentation

## 2023-05-19 DIAGNOSIS — X58XXXA Exposure to other specified factors, initial encounter: Secondary | ICD-10-CM | POA: Insufficient documentation

## 2023-05-19 NOTE — ED Triage Notes (Signed)
Patient arrives ambulatory by POV states he was prescribed flexeril and prednisone yesterday. This morning accidentally took 6 of the flexeril instead of the prednisone about 45 mins ago. States he drank a bunch of coffee and water and made himself vomit. Was given medication for stiff back.

## 2023-05-19 NOTE — ED Provider Notes (Signed)
[place] EMERGENCY DEPARTMENT AT [facility] Provider Note   CSN: [id number] Arrival date & time: 05/19/23  4098     History  Chief Complaint  Patient presents with   Ingestion    James Rowe is a 57 y.o. male.  57 year old male with past medical history of hypertension, hyperlipidemia, chronic back pain presented to the emergency department today after an accidental overdose on Flexeril.  The patient states that he was prescribed a prednisone taper.  He felt that he took 6 tablets of prednisone but realized that he took six 5 mg tablets of Flexeril.  The patient states that he drank a gallon of water and did vomit once.  He states that he denies any pills but did note that it did appear to be some pill fragments in his vomit.  He came to the ER for further evaluation at that time.  The patient states that he is "feeling relaxed" but denies any other symptoms at this time.   Ingestion       Home Medications Prior to Admission medications   Medication Sig Start Date End Date Taking? Authorizing Provider  cetirizine (ZYRTEC ALLERGY) 10 MG tablet as needed.    [provider]  lisinopril (ZESTRIL) 5 MG tablet Take 1 tablet by mouth daily.    [provider]  rosuvastatin (CRESTOR) 5 MG tablet TAKE 1 TABLET BY MOUTH DAILY 04/25/23   Hilty, Lisette Abu, MD  tadalafil (CIALIS) 5 MG tablet 1 tablet as needed    [provider]      Allergies    Patient has no known allergies.    Review of Systems   Review of Systems  All other systems reviewed and are negative.   Physical Exam Updated Vital Signs BP (!) 189/113   Pulse 90   Temp 97.9 F (36.6 C) (Oral)   Resp 18   Ht 5\' 9"  (1.753 m)   Wt 82.6 kg   SpO2 99%   BMI 26.88 kg/m  Physical Exam Vitals and nursing note reviewed.   Gen: NAD Eyes: PERRL, EOMI HEENT: no oropharyngeal swelling Neck: trachea midline Resp: clear to auscultation bilaterally Card: RRR, no murmurs,  rubs, or gallops Abd: nontender, nondistended Extremities: no calf tenderness, no edema Vascular: 2+ radial pulses bilaterally, 2+ DP pulses bilaterally Skin: no rashes Psyc: acting appropriately   ED Results / Procedures / Treatments   Labs (all labs ordered are listed, but only abnormal results are displayed) Labs Reviewed - No data to display  EKG None  Radiology No results found.  Procedures Procedures    Medications Ordered in ED Medications - No data to display  ED Course/ Medical Decision Making/ A&P                                 Medical Decision Making 57 year old male with past medical history of hypertension hyperlipidemia as well as chronic back pain presenting to the emergency department today after an accidental overdose of Flexeril.  However able to discuss the patient's case with poison control.  They did let us know that they would usually tell patients to stay at home for monitoring at home after an ingestion of this magnitude.  I did discuss this with the patient and his wife.  We discussed 6 hours of observation here in the emergency department versus going home with 6 hours of observation at home.  The patient ultimately will  be discharged through shared decision making.  His blood pressure is elevated but he states that he did not take any of his other medications this morning after realizing he accidentally took too many of the Flexeril until he was able to determine whether or not this would be safe.  I did instruct patient to go home and take these.  I think that he is safe for discharge.  He does not have any symptoms of endorgan dysfunction at this time.  He is discharged with return precautions.           Final Clinical Impression(s) / ED Diagnoses Final diagnoses:  Accidental drug ingestion, initial encounter    Rx / DC Orders ED Discharge Orders     None         Durwin Glaze, MD 05/19/23 [phone]

## 2023-05-19 NOTE — Discharge Instructions (Addendum)
The Poison Control Center recommends observation over period of 6 hours and she took your medication.  I think that is reasonable to go home for this.  If there are any concerns for significant sedation please return to the emergency department for reevaluation.  Please take your blood pressure medications and get home.  Return to the ER for worsening symptoms.

## 2023-05-19 NOTE — ED Notes (Signed)
This RN called poison control and spoke with [place]. States "if patient would have called from home then we would have not sent him to the ED." Recommend wife to monitor patient at home for about 6 hours and expect patient to be drowsy and it is fine for patient to sleep. Avoid driving and be on fall precautions.

## 2023-06-11 DIAGNOSIS — G4733 Obstructive sleep apnea (adult) (pediatric): Secondary | ICD-10-CM | POA: Diagnosis not present

## 2023-06-14 DIAGNOSIS — Z08 Encounter for follow-up examination after completed treatment for malignant neoplasm: Secondary | ICD-10-CM | POA: Diagnosis not present

## 2023-06-14 DIAGNOSIS — D225 Melanocytic nevi of trunk: Secondary | ICD-10-CM | POA: Diagnosis not present

## 2023-06-14 DIAGNOSIS — D2272 Melanocytic nevi of left lower limb, including hip: Secondary | ICD-10-CM | POA: Diagnosis not present

## 2023-06-14 DIAGNOSIS — Z1283 Encounter for screening for malignant neoplasm of skin: Secondary | ICD-10-CM | POA: Diagnosis not present

## 2023-06-14 DIAGNOSIS — D485 Neoplasm of uncertain behavior of skin: Secondary | ICD-10-CM | POA: Diagnosis not present

## 2023-06-14 DIAGNOSIS — Z85820 Personal history of malignant melanoma of skin: Secondary | ICD-10-CM | POA: Diagnosis not present

## 2023-07-07 DIAGNOSIS — G4733 Obstructive sleep apnea (adult) (pediatric): Secondary | ICD-10-CM | POA: Diagnosis not present

## 2023-07-11 DIAGNOSIS — M791 Myalgia, unspecified site: Secondary | ICD-10-CM | POA: Diagnosis not present

## 2023-07-11 DIAGNOSIS — M47816 Spondylosis without myelopathy or radiculopathy, lumbar region: Secondary | ICD-10-CM | POA: Diagnosis not present

## 2023-07-12 DIAGNOSIS — G4733 Obstructive sleep apnea (adult) (pediatric): Secondary | ICD-10-CM | POA: Diagnosis not present

## 2023-07-13 ENCOUNTER — Other Ambulatory Visit: Payer: Self-pay | Admitting: Rehabilitation

## 2023-07-13 DIAGNOSIS — M47816 Spondylosis without myelopathy or radiculopathy, lumbar region: Secondary | ICD-10-CM

## 2023-07-17 ENCOUNTER — Encounter: Payer: Self-pay | Admitting: Rehabilitation

## 2023-07-18 ENCOUNTER — Ambulatory Visit
Admission: RE | Admit: 2023-07-18 | Discharge: 2023-07-18 | Disposition: A | Discharge status: Home / Self Care | Payer: BC Managed Care – PPO | Source: Ambulatory Visit | Attending: Rehabilitation | Admitting: Rehabilitation

## 2023-07-18 DIAGNOSIS — M47816 Spondylosis without myelopathy or radiculopathy, lumbar region: Secondary | ICD-10-CM | POA: Diagnosis not present

## 2023-07-19 ENCOUNTER — Other Ambulatory Visit: Payer: Self-pay | Admitting: Rehabilitation

## 2023-07-19 DIAGNOSIS — R110 Nausea: Secondary | ICD-10-CM | POA: Diagnosis not present

## 2023-07-19 DIAGNOSIS — R972 Elevated prostate specific antigen [PSA]: Secondary | ICD-10-CM | POA: Diagnosis not present

## 2023-07-19 DIAGNOSIS — C8590 Non-Hodgkin lymphoma, unspecified, unspecified site: Secondary | ICD-10-CM

## 2023-07-19 DIAGNOSIS — M899 Disorder of bone, unspecified: Secondary | ICD-10-CM | POA: Diagnosis not present

## 2023-07-19 DIAGNOSIS — K769 Liver disease, unspecified: Secondary | ICD-10-CM | POA: Diagnosis not present

## 2023-07-20 ENCOUNTER — Encounter: Payer: Self-pay | Admitting: Rehabilitation

## 2023-07-20 ENCOUNTER — Other Ambulatory Visit: Payer: BC Managed Care – PPO

## 2023-07-20 ENCOUNTER — Other Ambulatory Visit: Payer: Self-pay | Admitting: Rehabilitation

## 2023-07-20 DIAGNOSIS — C8590 Non-Hodgkin lymphoma, unspecified, unspecified site: Secondary | ICD-10-CM

## 2023-07-21 ENCOUNTER — Ambulatory Visit
Admission: RE | Admit: 2023-07-21 | Discharge: 2023-07-21 | Disposition: A | Discharge status: Home / Self Care | Payer: BC Managed Care – PPO | Source: Ambulatory Visit | Attending: Rehabilitation | Admitting: Rehabilitation

## 2023-07-21 DIAGNOSIS — C8590 Non-Hodgkin lymphoma, unspecified, unspecified site: Secondary | ICD-10-CM | POA: Diagnosis not present

## 2023-07-21 MED ORDER — IOPAMIDOL (ISOVUE-300) INJECTION 61%
100.0000 mL | Freq: Once | INTRAVENOUS | Status: AC | PRN
  Administered 2023-07-21: 100 mL via INTRAVENOUS

## 2023-07-26 DIAGNOSIS — G8918 Other acute postprocedural pain: Secondary | ICD-10-CM | POA: Diagnosis not present

## 2023-07-26 DIAGNOSIS — R188 Other ascites: Secondary | ICD-10-CM | POA: Diagnosis not present

## 2023-07-26 DIAGNOSIS — K56699 Other intestinal obstruction unspecified as to partial versus complete obstruction: Secondary | ICD-10-CM | POA: Diagnosis not present

## 2023-07-26 DIAGNOSIS — Z4682 Encounter for fitting and adjustment of non-vascular catheter: Secondary | ICD-10-CM | POA: Diagnosis not present

## 2023-07-26 DIAGNOSIS — R000 Tachycardia, unspecified: Secondary | ICD-10-CM | POA: Diagnosis not present

## 2023-07-26 DIAGNOSIS — C4359 Malignant melanoma of other part of trunk: Secondary | ICD-10-CM | POA: Diagnosis not present

## 2023-07-26 DIAGNOSIS — R112 Nausea with vomiting, unspecified: Secondary | ICD-10-CM | POA: Diagnosis not present

## 2023-07-26 DIAGNOSIS — K561 Intussusception: Secondary | ICD-10-CM | POA: Diagnosis not present

## 2023-07-26 DIAGNOSIS — R7989 Other specified abnormal findings of blood chemistry: Secondary | ICD-10-CM | POA: Diagnosis not present

## 2023-07-26 DIAGNOSIS — D72829 Elevated white blood cell count, unspecified: Secondary | ICD-10-CM | POA: Diagnosis not present

## 2023-07-26 DIAGNOSIS — Z515 Encounter for palliative care: Secondary | ICD-10-CM | POA: Diagnosis not present

## 2023-07-26 DIAGNOSIS — K56609 Unspecified intestinal obstruction, unspecified as to partial versus complete obstruction: Secondary | ICD-10-CM | POA: Diagnosis not present

## 2023-07-26 DIAGNOSIS — Z6823 Body mass index (BMI) 23.0-23.9, adult: Secondary | ICD-10-CM | POA: Diagnosis not present

## 2023-07-26 DIAGNOSIS — C439 Malignant melanoma of skin, unspecified: Secondary | ICD-10-CM | POA: Diagnosis not present

## 2023-07-26 DIAGNOSIS — R066 Hiccough: Secondary | ICD-10-CM | POA: Diagnosis not present

## 2023-07-26 DIAGNOSIS — M5440 Lumbago with sciatica, unspecified side: Secondary | ICD-10-CM | POA: Diagnosis not present

## 2023-07-26 DIAGNOSIS — Z01818 Encounter for other preprocedural examination: Secondary | ICD-10-CM | POA: Diagnosis not present

## 2023-07-26 DIAGNOSIS — I10 Essential (primary) hypertension: Secondary | ICD-10-CM | POA: Diagnosis not present

## 2023-07-26 DIAGNOSIS — G4731 Primary central sleep apnea: Secondary | ICD-10-CM | POA: Diagnosis not present

## 2023-07-26 DIAGNOSIS — M8458XA Pathological fracture in neoplastic disease, other specified site, initial encounter for fracture: Secondary | ICD-10-CM | POA: Diagnosis not present

## 2023-07-26 DIAGNOSIS — K9189 Other postprocedural complications and disorders of digestive system: Secondary | ICD-10-CM | POA: Diagnosis not present

## 2023-07-26 DIAGNOSIS — C3431 Malignant neoplasm of lower lobe, right bronchus or lung: Secondary | ICD-10-CM | POA: Diagnosis not present

## 2023-07-26 DIAGNOSIS — M8448XA Pathological fracture, other site, initial encounter for fracture: Secondary | ICD-10-CM | POA: Diagnosis not present

## 2023-07-26 DIAGNOSIS — Z0389 Encounter for observation for other suspected diseases and conditions ruled out: Secondary | ICD-10-CM | POA: Diagnosis not present

## 2023-07-26 DIAGNOSIS — C799 Secondary malignant neoplasm of unspecified site: Secondary | ICD-10-CM | POA: Diagnosis not present

## 2023-07-26 DIAGNOSIS — R180 Malignant ascites: Secondary | ICD-10-CM | POA: Diagnosis not present

## 2023-07-26 DIAGNOSIS — M9933 Osseous stenosis of neural canal of lumbar region: Secondary | ICD-10-CM | POA: Diagnosis not present

## 2023-07-26 DIAGNOSIS — Z9189 Other specified personal risk factors, not elsewhere classified: Secondary | ICD-10-CM | POA: Diagnosis not present

## 2023-07-26 DIAGNOSIS — Z23 Encounter for immunization: Secondary | ICD-10-CM | POA: Diagnosis not present

## 2023-07-26 DIAGNOSIS — R630 Anorexia: Secondary | ICD-10-CM | POA: Diagnosis not present

## 2023-07-26 DIAGNOSIS — R7982 Elevated C-reactive protein (CRP): Secondary | ICD-10-CM | POA: Diagnosis not present

## 2023-07-26 DIAGNOSIS — G893 Neoplasm related pain (acute) (chronic): Secondary | ICD-10-CM | POA: Diagnosis not present

## 2023-07-26 DIAGNOSIS — C179 Malignant neoplasm of small intestine, unspecified: Secondary | ICD-10-CM | POA: Diagnosis not present

## 2023-07-26 DIAGNOSIS — K5903 Drug induced constipation: Secondary | ICD-10-CM | POA: Diagnosis not present

## 2023-07-26 DIAGNOSIS — R5381 Other malaise: Secondary | ICD-10-CM | POA: Diagnosis not present

## 2023-07-26 DIAGNOSIS — M48061 Spinal stenosis, lumbar region without neurogenic claudication: Secondary | ICD-10-CM | POA: Diagnosis not present

## 2023-07-26 DIAGNOSIS — K567 Ileus, unspecified: Secondary | ICD-10-CM | POA: Diagnosis not present

## 2023-07-26 DIAGNOSIS — E43 Unspecified severe protein-calorie malnutrition: Secondary | ICD-10-CM | POA: Diagnosis not present

## 2023-07-26 DIAGNOSIS — M898X8 Other specified disorders of bone, other site: Secondary | ICD-10-CM | POA: Diagnosis not present

## 2023-07-26 DIAGNOSIS — C801 Malignant (primary) neoplasm, unspecified: Secondary | ICD-10-CM | POA: Diagnosis not present

## 2023-07-26 DIAGNOSIS — J90 Pleural effusion, not elsewhere classified: Secondary | ICD-10-CM | POA: Diagnosis not present

## 2023-07-26 DIAGNOSIS — M4856XA Collapsed vertebra, not elsewhere classified, lumbar region, initial encounter for fracture: Secondary | ICD-10-CM | POA: Diagnosis not present

## 2023-07-26 DIAGNOSIS — C784 Secondary malignant neoplasm of small intestine: Secondary | ICD-10-CM | POA: Diagnosis not present

## 2023-07-26 DIAGNOSIS — C7951 Secondary malignant neoplasm of bone: Secondary | ICD-10-CM | POA: Diagnosis not present

## 2023-07-26 DIAGNOSIS — G9529 Other cord compression: Secondary | ICD-10-CM | POA: Diagnosis not present

## 2023-07-26 DIAGNOSIS — Z789 Other specified health status: Secondary | ICD-10-CM | POA: Diagnosis not present

## 2023-07-26 DIAGNOSIS — J9811 Atelectasis: Secondary | ICD-10-CM | POA: Diagnosis not present

## 2023-07-26 DIAGNOSIS — C787 Secondary malignant neoplasm of liver and intrahepatic bile duct: Secondary | ICD-10-CM | POA: Diagnosis not present

## 2023-07-26 DIAGNOSIS — R52 Pain, unspecified: Secondary | ICD-10-CM | POA: Diagnosis not present

## 2023-07-26 DIAGNOSIS — I2699 Other pulmonary embolism without acute cor pulmonale: Secondary | ICD-10-CM | POA: Diagnosis not present

## 2023-07-26 DIAGNOSIS — D630 Anemia in neoplastic disease: Secondary | ICD-10-CM | POA: Diagnosis not present

## 2023-07-27 DIAGNOSIS — K5903 Drug induced constipation: Secondary | ICD-10-CM | POA: Diagnosis not present

## 2023-07-27 DIAGNOSIS — G893 Neoplasm related pain (acute) (chronic): Secondary | ICD-10-CM | POA: Diagnosis not present

## 2023-07-27 DIAGNOSIS — R52 Pain, unspecified: Secondary | ICD-10-CM | POA: Diagnosis not present

## 2023-07-27 DIAGNOSIS — M48061 Spinal stenosis, lumbar region without neurogenic claudication: Secondary | ICD-10-CM | POA: Diagnosis not present

## 2023-07-27 DIAGNOSIS — C7951 Secondary malignant neoplasm of bone: Secondary | ICD-10-CM | POA: Diagnosis not present

## 2023-07-27 DIAGNOSIS — Z0389 Encounter for observation for other suspected diseases and conditions ruled out: Secondary | ICD-10-CM | POA: Diagnosis not present

## 2023-07-27 DIAGNOSIS — M4856XA Collapsed vertebra, not elsewhere classified, lumbar region, initial encounter for fracture: Secondary | ICD-10-CM | POA: Diagnosis not present

## 2023-07-27 DIAGNOSIS — R630 Anorexia: Secondary | ICD-10-CM | POA: Diagnosis not present

## 2023-07-27 DIAGNOSIS — M9933 Osseous stenosis of neural canal of lumbar region: Secondary | ICD-10-CM | POA: Diagnosis not present

## 2023-07-27 DIAGNOSIS — C799 Secondary malignant neoplasm of unspecified site: Secondary | ICD-10-CM | POA: Diagnosis not present

## 2023-07-28 DIAGNOSIS — R630 Anorexia: Secondary | ICD-10-CM | POA: Diagnosis not present

## 2023-07-28 DIAGNOSIS — C7951 Secondary malignant neoplasm of bone: Secondary | ICD-10-CM | POA: Diagnosis not present

## 2023-07-28 DIAGNOSIS — M898X8 Other specified disorders of bone, other site: Secondary | ICD-10-CM | POA: Diagnosis not present

## 2023-07-28 DIAGNOSIS — K5903 Drug induced constipation: Secondary | ICD-10-CM | POA: Diagnosis not present

## 2023-07-28 DIAGNOSIS — G893 Neoplasm related pain (acute) (chronic): Secondary | ICD-10-CM | POA: Diagnosis not present

## 2023-07-28 DIAGNOSIS — C801 Malignant (primary) neoplasm, unspecified: Secondary | ICD-10-CM | POA: Diagnosis not present

## 2023-07-29 DIAGNOSIS — Z01818 Encounter for other preprocedural examination: Secondary | ICD-10-CM | POA: Diagnosis not present

## 2023-07-29 DIAGNOSIS — R5381 Other malaise: Secondary | ICD-10-CM | POA: Diagnosis not present

## 2023-07-29 DIAGNOSIS — J9811 Atelectasis: Secondary | ICD-10-CM | POA: Diagnosis not present

## 2023-07-29 DIAGNOSIS — Z4682 Encounter for fitting and adjustment of non-vascular catheter: Secondary | ICD-10-CM | POA: Diagnosis not present

## 2023-07-29 DIAGNOSIS — K56609 Unspecified intestinal obstruction, unspecified as to partial versus complete obstruction: Secondary | ICD-10-CM | POA: Diagnosis not present

## 2023-07-29 DIAGNOSIS — C787 Secondary malignant neoplasm of liver and intrahepatic bile duct: Secondary | ICD-10-CM | POA: Diagnosis not present

## 2023-07-29 DIAGNOSIS — C7951 Secondary malignant neoplasm of bone: Secondary | ICD-10-CM | POA: Diagnosis not present

## 2023-07-29 DIAGNOSIS — R066 Hiccough: Secondary | ICD-10-CM | POA: Diagnosis not present

## 2023-07-29 DIAGNOSIS — K56699 Other intestinal obstruction unspecified as to partial versus complete obstruction: Secondary | ICD-10-CM | POA: Diagnosis not present

## 2023-07-29 DIAGNOSIS — R112 Nausea with vomiting, unspecified: Secondary | ICD-10-CM | POA: Diagnosis not present

## 2023-07-29 DIAGNOSIS — G893 Neoplasm related pain (acute) (chronic): Secondary | ICD-10-CM | POA: Diagnosis not present

## 2023-07-30 DIAGNOSIS — C784 Secondary malignant neoplasm of small intestine: Secondary | ICD-10-CM | POA: Diagnosis not present

## 2023-07-30 DIAGNOSIS — G893 Neoplasm related pain (acute) (chronic): Secondary | ICD-10-CM | POA: Diagnosis not present

## 2023-07-30 DIAGNOSIS — C4359 Malignant melanoma of other part of trunk: Secondary | ICD-10-CM | POA: Diagnosis not present

## 2023-07-30 DIAGNOSIS — K56609 Unspecified intestinal obstruction, unspecified as to partial versus complete obstruction: Secondary | ICD-10-CM | POA: Diagnosis not present

## 2023-07-30 DIAGNOSIS — R066 Hiccough: Secondary | ICD-10-CM | POA: Diagnosis not present

## 2023-07-30 DIAGNOSIS — R180 Malignant ascites: Secondary | ICD-10-CM | POA: Diagnosis not present

## 2023-07-30 DIAGNOSIS — R5381 Other malaise: Secondary | ICD-10-CM | POA: Diagnosis not present

## 2023-07-30 DIAGNOSIS — R112 Nausea with vomiting, unspecified: Secondary | ICD-10-CM | POA: Diagnosis not present

## 2023-07-30 DIAGNOSIS — C439 Malignant melanoma of skin, unspecified: Secondary | ICD-10-CM | POA: Diagnosis not present

## 2023-07-30 DIAGNOSIS — C7951 Secondary malignant neoplasm of bone: Secondary | ICD-10-CM | POA: Diagnosis not present

## 2023-07-31 DIAGNOSIS — M8458XA Pathological fracture in neoplastic disease, other specified site, initial encounter for fracture: Secondary | ICD-10-CM | POA: Diagnosis not present

## 2023-07-31 DIAGNOSIS — G9529 Other cord compression: Secondary | ICD-10-CM | POA: Diagnosis not present

## 2023-07-31 DIAGNOSIS — C179 Malignant neoplasm of small intestine, unspecified: Secondary | ICD-10-CM | POA: Diagnosis not present

## 2023-07-31 DIAGNOSIS — C439 Malignant melanoma of skin, unspecified: Secondary | ICD-10-CM | POA: Diagnosis not present

## 2023-07-31 DIAGNOSIS — C784 Secondary malignant neoplasm of small intestine: Secondary | ICD-10-CM | POA: Diagnosis not present

## 2023-07-31 DIAGNOSIS — C7951 Secondary malignant neoplasm of bone: Secondary | ICD-10-CM | POA: Diagnosis not present

## 2023-07-31 DIAGNOSIS — M8448XA Pathological fracture, other site, initial encounter for fracture: Secondary | ICD-10-CM | POA: Diagnosis not present

## 2023-08-01 ENCOUNTER — Other Ambulatory Visit: Payer: BC Managed Care – PPO

## 2023-08-01 DIAGNOSIS — C7951 Secondary malignant neoplasm of bone: Secondary | ICD-10-CM | POA: Diagnosis not present

## 2023-08-01 DIAGNOSIS — J90 Pleural effusion, not elsewhere classified: Secondary | ICD-10-CM | POA: Diagnosis not present

## 2023-08-01 DIAGNOSIS — R7989 Other specified abnormal findings of blood chemistry: Secondary | ICD-10-CM | POA: Diagnosis not present

## 2023-08-01 DIAGNOSIS — K56609 Unspecified intestinal obstruction, unspecified as to partial versus complete obstruction: Secondary | ICD-10-CM | POA: Diagnosis not present

## 2023-08-01 DIAGNOSIS — I2699 Other pulmonary embolism without acute cor pulmonale: Secondary | ICD-10-CM | POA: Diagnosis not present

## 2023-08-01 DIAGNOSIS — K567 Ileus, unspecified: Secondary | ICD-10-CM | POA: Diagnosis not present

## 2023-08-01 DIAGNOSIS — G893 Neoplasm related pain (acute) (chronic): Secondary | ICD-10-CM | POA: Diagnosis not present

## 2023-08-01 DIAGNOSIS — R000 Tachycardia, unspecified: Secondary | ICD-10-CM | POA: Diagnosis not present

## 2023-08-01 DIAGNOSIS — C3431 Malignant neoplasm of lower lobe, right bronchus or lung: Secondary | ICD-10-CM | POA: Diagnosis not present

## 2023-08-02 DIAGNOSIS — K56609 Unspecified intestinal obstruction, unspecified as to partial versus complete obstruction: Secondary | ICD-10-CM | POA: Diagnosis not present

## 2023-08-02 DIAGNOSIS — G893 Neoplasm related pain (acute) (chronic): Secondary | ICD-10-CM | POA: Diagnosis not present

## 2023-08-02 DIAGNOSIS — Z789 Other specified health status: Secondary | ICD-10-CM | POA: Diagnosis not present

## 2023-08-02 DIAGNOSIS — E43 Unspecified severe protein-calorie malnutrition: Secondary | ICD-10-CM | POA: Diagnosis not present

## 2023-08-02 DIAGNOSIS — K567 Ileus, unspecified: Secondary | ICD-10-CM | POA: Diagnosis not present

## 2023-08-02 DIAGNOSIS — C7951 Secondary malignant neoplasm of bone: Secondary | ICD-10-CM | POA: Diagnosis not present

## 2023-08-03 DIAGNOSIS — C7951 Secondary malignant neoplasm of bone: Secondary | ICD-10-CM | POA: Diagnosis not present

## 2023-08-03 DIAGNOSIS — G8918 Other acute postprocedural pain: Secondary | ICD-10-CM | POA: Diagnosis not present

## 2023-08-03 DIAGNOSIS — K567 Ileus, unspecified: Secondary | ICD-10-CM | POA: Diagnosis not present

## 2023-08-03 DIAGNOSIS — G893 Neoplasm related pain (acute) (chronic): Secondary | ICD-10-CM | POA: Diagnosis not present

## 2023-08-03 DIAGNOSIS — K56609 Unspecified intestinal obstruction, unspecified as to partial versus complete obstruction: Secondary | ICD-10-CM | POA: Diagnosis not present

## 2023-08-04 DIAGNOSIS — K567 Ileus, unspecified: Secondary | ICD-10-CM | POA: Diagnosis not present

## 2023-08-04 DIAGNOSIS — C7951 Secondary malignant neoplasm of bone: Secondary | ICD-10-CM | POA: Diagnosis not present

## 2023-08-04 DIAGNOSIS — K56609 Unspecified intestinal obstruction, unspecified as to partial versus complete obstruction: Secondary | ICD-10-CM | POA: Diagnosis not present

## 2023-08-04 DIAGNOSIS — G893 Neoplasm related pain (acute) (chronic): Secondary | ICD-10-CM | POA: Diagnosis not present

## 2023-08-05 DIAGNOSIS — C7951 Secondary malignant neoplasm of bone: Secondary | ICD-10-CM | POA: Diagnosis not present

## 2023-08-06 DIAGNOSIS — K567 Ileus, unspecified: Secondary | ICD-10-CM | POA: Diagnosis not present

## 2023-08-06 DIAGNOSIS — G893 Neoplasm related pain (acute) (chronic): Secondary | ICD-10-CM | POA: Diagnosis not present

## 2023-08-06 DIAGNOSIS — C7951 Secondary malignant neoplasm of bone: Secondary | ICD-10-CM | POA: Diagnosis not present

## 2023-08-06 DIAGNOSIS — K56609 Unspecified intestinal obstruction, unspecified as to partial versus complete obstruction: Secondary | ICD-10-CM | POA: Diagnosis not present

## 2023-08-07 DIAGNOSIS — G893 Neoplasm related pain (acute) (chronic): Secondary | ICD-10-CM | POA: Diagnosis not present

## 2023-08-07 DIAGNOSIS — C7951 Secondary malignant neoplasm of bone: Secondary | ICD-10-CM | POA: Diagnosis not present

## 2023-08-07 DIAGNOSIS — Z515 Encounter for palliative care: Secondary | ICD-10-CM | POA: Diagnosis not present

## 2023-08-08 DIAGNOSIS — Z515 Encounter for palliative care: Secondary | ICD-10-CM | POA: Diagnosis not present

## 2023-08-08 DIAGNOSIS — Z9189 Other specified personal risk factors, not elsewhere classified: Secondary | ICD-10-CM | POA: Diagnosis not present

## 2023-08-08 DIAGNOSIS — G893 Neoplasm related pain (acute) (chronic): Secondary | ICD-10-CM | POA: Diagnosis not present

## 2023-08-10 DIAGNOSIS — Z1152 Encounter for screening for COVID-19: Secondary | ICD-10-CM | POA: Diagnosis not present

## 2023-08-10 DIAGNOSIS — Z483 Aftercare following surgery for neoplasm: Secondary | ICD-10-CM | POA: Diagnosis not present

## 2023-08-10 DIAGNOSIS — Z79899 Other long term (current) drug therapy: Secondary | ICD-10-CM | POA: Diagnosis not present

## 2023-08-10 DIAGNOSIS — R5082 Postprocedural fever: Secondary | ICD-10-CM | POA: Diagnosis not present

## 2023-08-10 DIAGNOSIS — R161 Splenomegaly, not elsewhere classified: Secondary | ICD-10-CM | POA: Diagnosis not present

## 2023-08-10 DIAGNOSIS — Z604 Social exclusion and rejection: Secondary | ICD-10-CM | POA: Diagnosis not present

## 2023-08-10 DIAGNOSIS — E785 Hyperlipidemia, unspecified: Secondary | ICD-10-CM | POA: Diagnosis not present

## 2023-08-10 DIAGNOSIS — J90 Pleural effusion, not elsewhere classified: Secondary | ICD-10-CM | POA: Diagnosis not present

## 2023-08-10 DIAGNOSIS — Z79891 Long term (current) use of opiate analgesic: Secondary | ICD-10-CM | POA: Diagnosis not present

## 2023-08-10 DIAGNOSIS — Z0389 Encounter for observation for other suspected diseases and conditions ruled out: Secondary | ICD-10-CM | POA: Diagnosis not present

## 2023-08-10 DIAGNOSIS — E871 Hypo-osmolality and hyponatremia: Secondary | ICD-10-CM | POA: Diagnosis not present

## 2023-08-10 DIAGNOSIS — R918 Other nonspecific abnormal finding of lung field: Secondary | ICD-10-CM | POA: Diagnosis not present

## 2023-08-10 DIAGNOSIS — R188 Other ascites: Secondary | ICD-10-CM | POA: Diagnosis not present

## 2023-08-10 DIAGNOSIS — G893 Neoplasm related pain (acute) (chronic): Secondary | ICD-10-CM | POA: Diagnosis not present

## 2023-08-10 DIAGNOSIS — C7951 Secondary malignant neoplasm of bone: Secondary | ICD-10-CM | POA: Diagnosis not present

## 2023-08-10 DIAGNOSIS — G4733 Obstructive sleep apnea (adult) (pediatric): Secondary | ICD-10-CM | POA: Diagnosis not present

## 2023-08-10 DIAGNOSIS — J9811 Atelectasis: Secondary | ICD-10-CM | POA: Diagnosis not present

## 2023-08-10 DIAGNOSIS — Z87891 Personal history of nicotine dependence: Secondary | ICD-10-CM | POA: Diagnosis not present

## 2023-08-10 DIAGNOSIS — K668 Other specified disorders of peritoneum: Secondary | ICD-10-CM | POA: Diagnosis not present

## 2023-08-10 DIAGNOSIS — C787 Secondary malignant neoplasm of liver and intrahepatic bile duct: Secondary | ICD-10-CM | POA: Diagnosis not present

## 2023-08-10 DIAGNOSIS — I2699 Other pulmonary embolism without acute cor pulmonale: Secondary | ICD-10-CM | POA: Diagnosis not present

## 2023-08-10 DIAGNOSIS — R0682 Tachypnea, not elsewhere classified: Secondary | ICD-10-CM | POA: Diagnosis not present

## 2023-08-10 DIAGNOSIS — D72829 Elevated white blood cell count, unspecified: Secondary | ICD-10-CM | POA: Diagnosis not present

## 2023-08-10 DIAGNOSIS — M8458XD Pathological fracture in neoplastic disease, other specified site, subsequent encounter for fracture with routine healing: Secondary | ICD-10-CM | POA: Diagnosis not present

## 2023-08-10 DIAGNOSIS — Z7901 Long term (current) use of anticoagulants: Secondary | ICD-10-CM | POA: Diagnosis not present

## 2023-08-10 DIAGNOSIS — R590 Localized enlarged lymph nodes: Secondary | ICD-10-CM | POA: Diagnosis not present

## 2023-08-10 DIAGNOSIS — R000 Tachycardia, unspecified: Secondary | ICD-10-CM | POA: Diagnosis not present

## 2023-08-10 DIAGNOSIS — Z85068 Personal history of other malignant neoplasm of small intestine: Secondary | ICD-10-CM | POA: Diagnosis not present

## 2023-08-10 DIAGNOSIS — Z981 Arthrodesis status: Secondary | ICD-10-CM | POA: Diagnosis not present

## 2023-08-10 DIAGNOSIS — I10 Essential (primary) hypertension: Secondary | ICD-10-CM | POA: Diagnosis not present

## 2023-08-10 DIAGNOSIS — I81 Portal vein thrombosis: Secondary | ICD-10-CM | POA: Diagnosis not present

## 2023-08-10 DIAGNOSIS — R509 Fever, unspecified: Secondary | ICD-10-CM | POA: Diagnosis not present

## 2023-08-10 DIAGNOSIS — C179 Malignant neoplasm of small intestine, unspecified: Secondary | ICD-10-CM | POA: Diagnosis not present

## 2023-08-15 DIAGNOSIS — I81 Portal vein thrombosis: Secondary | ICD-10-CM | POA: Diagnosis not present

## 2023-08-15 DIAGNOSIS — C7951 Secondary malignant neoplasm of bone: Secondary | ICD-10-CM | POA: Diagnosis not present

## 2023-08-15 DIAGNOSIS — C787 Secondary malignant neoplasm of liver and intrahepatic bile duct: Secondary | ICD-10-CM | POA: Diagnosis not present

## 2023-08-15 DIAGNOSIS — K668 Other specified disorders of peritoneum: Secondary | ICD-10-CM | POA: Diagnosis not present

## 2023-08-15 DIAGNOSIS — C801 Malignant (primary) neoplasm, unspecified: Secondary | ICD-10-CM | POA: Diagnosis not present

## 2023-08-15 DIAGNOSIS — R222 Localized swelling, mass and lump, trunk: Secondary | ICD-10-CM | POA: Diagnosis not present

## 2023-08-15 DIAGNOSIS — K56609 Unspecified intestinal obstruction, unspecified as to partial versus complete obstruction: Secondary | ICD-10-CM | POA: Diagnosis not present

## 2023-08-15 DIAGNOSIS — T148XXA Other injury of unspecified body region, initial encounter: Secondary | ICD-10-CM | POA: Diagnosis not present

## 2023-08-15 DIAGNOSIS — C179 Malignant neoplasm of small intestine, unspecified: Secondary | ICD-10-CM | POA: Diagnosis not present

## 2023-08-15 DIAGNOSIS — X58XXXA Exposure to other specified factors, initial encounter: Secondary | ICD-10-CM | POA: Diagnosis not present

## 2023-08-16 DIAGNOSIS — C7951 Secondary malignant neoplasm of bone: Secondary | ICD-10-CM | POA: Diagnosis not present

## 2023-08-16 DIAGNOSIS — Z87891 Personal history of nicotine dependence: Secondary | ICD-10-CM | POA: Diagnosis not present

## 2023-08-16 DIAGNOSIS — G8929 Other chronic pain: Secondary | ICD-10-CM | POA: Diagnosis not present

## 2023-08-16 DIAGNOSIS — Z79899 Other long term (current) drug therapy: Secondary | ICD-10-CM | POA: Diagnosis not present

## 2023-08-16 DIAGNOSIS — R188 Other ascites: Secondary | ICD-10-CM | POA: Diagnosis not present

## 2023-08-16 DIAGNOSIS — I10 Essential (primary) hypertension: Secondary | ICD-10-CM | POA: Diagnosis not present

## 2023-08-16 DIAGNOSIS — C787 Secondary malignant neoplasm of liver and intrahepatic bile duct: Secondary | ICD-10-CM | POA: Diagnosis not present

## 2023-08-16 DIAGNOSIS — K5903 Drug induced constipation: Secondary | ICD-10-CM | POA: Diagnosis not present

## 2023-08-16 DIAGNOSIS — G893 Neoplasm related pain (acute) (chronic): Secondary | ICD-10-CM | POA: Diagnosis not present

## 2023-08-16 DIAGNOSIS — G8918 Other acute postprocedural pain: Secondary | ICD-10-CM | POA: Diagnosis not present

## 2023-08-16 DIAGNOSIS — Z515 Encounter for palliative care: Secondary | ICD-10-CM | POA: Diagnosis not present

## 2023-08-16 DIAGNOSIS — K56699 Other intestinal obstruction unspecified as to partial versus complete obstruction: Secondary | ICD-10-CM | POA: Diagnosis not present

## 2023-08-16 DIAGNOSIS — K56609 Unspecified intestinal obstruction, unspecified as to partial versus complete obstruction: Secondary | ICD-10-CM | POA: Diagnosis not present

## 2023-08-18 DIAGNOSIS — I10 Essential (primary) hypertension: Secondary | ICD-10-CM | POA: Diagnosis not present

## 2023-08-18 DIAGNOSIS — Z604 Social exclusion and rejection: Secondary | ICD-10-CM | POA: Diagnosis not present

## 2023-08-18 DIAGNOSIS — C7951 Secondary malignant neoplasm of bone: Secondary | ICD-10-CM | POA: Diagnosis not present

## 2023-08-18 DIAGNOSIS — R5082 Postprocedural fever: Secondary | ICD-10-CM | POA: Diagnosis not present

## 2023-08-18 DIAGNOSIS — E785 Hyperlipidemia, unspecified: Secondary | ICD-10-CM | POA: Diagnosis not present

## 2023-08-18 DIAGNOSIS — Z7901 Long term (current) use of anticoagulants: Secondary | ICD-10-CM | POA: Diagnosis not present

## 2023-08-18 DIAGNOSIS — R000 Tachycardia, unspecified: Secondary | ICD-10-CM | POA: Diagnosis not present

## 2023-08-18 DIAGNOSIS — R0682 Tachypnea, not elsewhere classified: Secondary | ICD-10-CM | POA: Diagnosis not present

## 2023-08-18 DIAGNOSIS — Z483 Aftercare following surgery for neoplasm: Secondary | ICD-10-CM | POA: Diagnosis not present

## 2023-08-18 DIAGNOSIS — C787 Secondary malignant neoplasm of liver and intrahepatic bile duct: Secondary | ICD-10-CM | POA: Diagnosis not present

## 2023-08-18 DIAGNOSIS — C179 Malignant neoplasm of small intestine, unspecified: Secondary | ICD-10-CM | POA: Diagnosis not present

## 2023-08-18 DIAGNOSIS — G893 Neoplasm related pain (acute) (chronic): Secondary | ICD-10-CM | POA: Diagnosis not present

## 2023-08-18 DIAGNOSIS — Z79891 Long term (current) use of opiate analgesic: Secondary | ICD-10-CM | POA: Diagnosis not present

## 2023-08-18 DIAGNOSIS — D72829 Elevated white blood cell count, unspecified: Secondary | ICD-10-CM | POA: Diagnosis not present

## 2023-08-18 DIAGNOSIS — M8458XD Pathological fracture in neoplastic disease, other specified site, subsequent encounter for fracture with routine healing: Secondary | ICD-10-CM | POA: Diagnosis not present

## 2023-08-23 DIAGNOSIS — C7951 Secondary malignant neoplasm of bone: Secondary | ICD-10-CM | POA: Diagnosis not present

## 2023-08-25 DIAGNOSIS — C179 Malignant neoplasm of small intestine, unspecified: Secondary | ICD-10-CM | POA: Diagnosis not present

## 2023-08-25 DIAGNOSIS — I10 Essential (primary) hypertension: Secondary | ICD-10-CM | POA: Diagnosis not present

## 2023-08-25 DIAGNOSIS — R0682 Tachypnea, not elsewhere classified: Secondary | ICD-10-CM | POA: Diagnosis not present

## 2023-08-25 DIAGNOSIS — C7951 Secondary malignant neoplasm of bone: Secondary | ICD-10-CM | POA: Diagnosis not present

## 2023-08-25 DIAGNOSIS — D72829 Elevated white blood cell count, unspecified: Secondary | ICD-10-CM | POA: Diagnosis not present

## 2023-08-25 DIAGNOSIS — G893 Neoplasm related pain (acute) (chronic): Secondary | ICD-10-CM | POA: Diagnosis not present

## 2023-08-25 DIAGNOSIS — Z604 Social exclusion and rejection: Secondary | ICD-10-CM | POA: Diagnosis not present

## 2023-08-25 DIAGNOSIS — E785 Hyperlipidemia, unspecified: Secondary | ICD-10-CM | POA: Diagnosis not present

## 2023-08-25 DIAGNOSIS — M8458XD Pathological fracture in neoplastic disease, other specified site, subsequent encounter for fracture with routine healing: Secondary | ICD-10-CM | POA: Diagnosis not present

## 2023-08-25 DIAGNOSIS — R000 Tachycardia, unspecified: Secondary | ICD-10-CM | POA: Diagnosis not present

## 2023-08-25 DIAGNOSIS — Z7901 Long term (current) use of anticoagulants: Secondary | ICD-10-CM | POA: Diagnosis not present

## 2023-08-25 DIAGNOSIS — Z79891 Long term (current) use of opiate analgesic: Secondary | ICD-10-CM | POA: Diagnosis not present

## 2023-08-25 DIAGNOSIS — R5082 Postprocedural fever: Secondary | ICD-10-CM | POA: Diagnosis not present

## 2023-08-25 DIAGNOSIS — Z483 Aftercare following surgery for neoplasm: Secondary | ICD-10-CM | POA: Diagnosis not present

## 2023-08-25 DIAGNOSIS — C787 Secondary malignant neoplasm of liver and intrahepatic bile duct: Secondary | ICD-10-CM | POA: Diagnosis not present

## 2023-08-26 DIAGNOSIS — E785 Hyperlipidemia, unspecified: Secondary | ICD-10-CM | POA: Diagnosis not present

## 2023-08-26 DIAGNOSIS — Z79891 Long term (current) use of opiate analgesic: Secondary | ICD-10-CM | POA: Diagnosis not present

## 2023-08-26 DIAGNOSIS — I10 Essential (primary) hypertension: Secondary | ICD-10-CM | POA: Diagnosis not present

## 2023-08-26 DIAGNOSIS — Z7901 Long term (current) use of anticoagulants: Secondary | ICD-10-CM | POA: Diagnosis not present

## 2023-08-26 DIAGNOSIS — M8458XD Pathological fracture in neoplastic disease, other specified site, subsequent encounter for fracture with routine healing: Secondary | ICD-10-CM | POA: Diagnosis not present

## 2023-08-26 DIAGNOSIS — D72829 Elevated white blood cell count, unspecified: Secondary | ICD-10-CM | POA: Diagnosis not present

## 2023-08-26 DIAGNOSIS — R5082 Postprocedural fever: Secondary | ICD-10-CM | POA: Diagnosis not present

## 2023-08-26 DIAGNOSIS — Z604 Social exclusion and rejection: Secondary | ICD-10-CM | POA: Diagnosis not present

## 2023-08-26 DIAGNOSIS — Z483 Aftercare following surgery for neoplasm: Secondary | ICD-10-CM | POA: Diagnosis not present

## 2023-08-26 DIAGNOSIS — G893 Neoplasm related pain (acute) (chronic): Secondary | ICD-10-CM | POA: Diagnosis not present

## 2023-08-26 DIAGNOSIS — C787 Secondary malignant neoplasm of liver and intrahepatic bile duct: Secondary | ICD-10-CM | POA: Diagnosis not present

## 2023-08-26 DIAGNOSIS — C7951 Secondary malignant neoplasm of bone: Secondary | ICD-10-CM | POA: Diagnosis not present

## 2023-08-26 DIAGNOSIS — C179 Malignant neoplasm of small intestine, unspecified: Secondary | ICD-10-CM | POA: Diagnosis not present

## 2023-08-26 DIAGNOSIS — R000 Tachycardia, unspecified: Secondary | ICD-10-CM | POA: Diagnosis not present

## 2023-08-26 DIAGNOSIS — R0682 Tachypnea, not elsewhere classified: Secondary | ICD-10-CM | POA: Diagnosis not present

## 2023-08-28 DIAGNOSIS — C787 Secondary malignant neoplasm of liver and intrahepatic bile duct: Secondary | ICD-10-CM | POA: Diagnosis not present

## 2023-08-28 DIAGNOSIS — C439 Malignant melanoma of skin, unspecified: Secondary | ICD-10-CM | POA: Diagnosis not present

## 2023-08-28 DIAGNOSIS — C7951 Secondary malignant neoplasm of bone: Secondary | ICD-10-CM | POA: Diagnosis not present

## 2023-08-28 DIAGNOSIS — Z4802 Encounter for removal of sutures: Secondary | ICD-10-CM | POA: Diagnosis not present

## 2023-08-29 DIAGNOSIS — Z86711 Personal history of pulmonary embolism: Secondary | ICD-10-CM | POA: Diagnosis not present

## 2023-08-29 DIAGNOSIS — C787 Secondary malignant neoplasm of liver and intrahepatic bile duct: Secondary | ICD-10-CM | POA: Diagnosis not present

## 2023-08-29 DIAGNOSIS — Z87891 Personal history of nicotine dependence: Secondary | ICD-10-CM | POA: Diagnosis not present

## 2023-08-29 DIAGNOSIS — Z7901 Long term (current) use of anticoagulants: Secondary | ICD-10-CM | POA: Diagnosis not present

## 2023-08-29 DIAGNOSIS — R634 Abnormal weight loss: Secondary | ICD-10-CM | POA: Diagnosis not present

## 2023-08-29 DIAGNOSIS — C7951 Secondary malignant neoplasm of bone: Secondary | ICD-10-CM | POA: Diagnosis not present

## 2023-08-29 DIAGNOSIS — C439 Malignant melanoma of skin, unspecified: Secondary | ICD-10-CM | POA: Diagnosis not present

## 2023-08-30 DIAGNOSIS — Z483 Aftercare following surgery for neoplasm: Secondary | ICD-10-CM | POA: Diagnosis not present

## 2023-08-30 DIAGNOSIS — C787 Secondary malignant neoplasm of liver and intrahepatic bile duct: Secondary | ICD-10-CM | POA: Diagnosis not present

## 2023-08-30 DIAGNOSIS — R5082 Postprocedural fever: Secondary | ICD-10-CM | POA: Diagnosis not present

## 2023-08-30 DIAGNOSIS — Z7901 Long term (current) use of anticoagulants: Secondary | ICD-10-CM | POA: Diagnosis not present

## 2023-08-30 DIAGNOSIS — G893 Neoplasm related pain (acute) (chronic): Secondary | ICD-10-CM | POA: Diagnosis not present

## 2023-08-30 DIAGNOSIS — C179 Malignant neoplasm of small intestine, unspecified: Secondary | ICD-10-CM | POA: Diagnosis not present

## 2023-08-30 DIAGNOSIS — E785 Hyperlipidemia, unspecified: Secondary | ICD-10-CM | POA: Diagnosis not present

## 2023-08-30 DIAGNOSIS — M8458XD Pathological fracture in neoplastic disease, other specified site, subsequent encounter for fracture with routine healing: Secondary | ICD-10-CM | POA: Diagnosis not present

## 2023-08-30 DIAGNOSIS — Z604 Social exclusion and rejection: Secondary | ICD-10-CM | POA: Diagnosis not present

## 2023-08-30 DIAGNOSIS — Z79891 Long term (current) use of opiate analgesic: Secondary | ICD-10-CM | POA: Diagnosis not present

## 2023-08-30 DIAGNOSIS — I10 Essential (primary) hypertension: Secondary | ICD-10-CM | POA: Diagnosis not present

## 2023-08-30 DIAGNOSIS — R000 Tachycardia, unspecified: Secondary | ICD-10-CM | POA: Diagnosis not present

## 2023-08-30 DIAGNOSIS — D72829 Elevated white blood cell count, unspecified: Secondary | ICD-10-CM | POA: Diagnosis not present

## 2023-08-30 DIAGNOSIS — C7951 Secondary malignant neoplasm of bone: Secondary | ICD-10-CM | POA: Diagnosis not present

## 2023-08-30 DIAGNOSIS — R0682 Tachypnea, not elsewhere classified: Secondary | ICD-10-CM | POA: Diagnosis not present

## 2023-09-01 DIAGNOSIS — E785 Hyperlipidemia, unspecified: Secondary | ICD-10-CM | POA: Diagnosis not present

## 2023-09-01 DIAGNOSIS — Z604 Social exclusion and rejection: Secondary | ICD-10-CM | POA: Diagnosis not present

## 2023-09-01 DIAGNOSIS — G893 Neoplasm related pain (acute) (chronic): Secondary | ICD-10-CM | POA: Diagnosis not present

## 2023-09-01 DIAGNOSIS — Z7901 Long term (current) use of anticoagulants: Secondary | ICD-10-CM | POA: Diagnosis not present

## 2023-09-01 DIAGNOSIS — D72829 Elevated white blood cell count, unspecified: Secondary | ICD-10-CM | POA: Diagnosis not present

## 2023-09-01 DIAGNOSIS — R0682 Tachypnea, not elsewhere classified: Secondary | ICD-10-CM | POA: Diagnosis not present

## 2023-09-01 DIAGNOSIS — C7951 Secondary malignant neoplasm of bone: Secondary | ICD-10-CM | POA: Diagnosis not present

## 2023-09-01 DIAGNOSIS — Z79891 Long term (current) use of opiate analgesic: Secondary | ICD-10-CM | POA: Diagnosis not present

## 2023-09-01 DIAGNOSIS — I10 Essential (primary) hypertension: Secondary | ICD-10-CM | POA: Diagnosis not present

## 2023-09-01 DIAGNOSIS — R5082 Postprocedural fever: Secondary | ICD-10-CM | POA: Diagnosis not present

## 2023-09-01 DIAGNOSIS — Z483 Aftercare following surgery for neoplasm: Secondary | ICD-10-CM | POA: Diagnosis not present

## 2023-09-01 DIAGNOSIS — R000 Tachycardia, unspecified: Secondary | ICD-10-CM | POA: Diagnosis not present

## 2023-09-01 DIAGNOSIS — M8458XD Pathological fracture in neoplastic disease, other specified site, subsequent encounter for fracture with routine healing: Secondary | ICD-10-CM | POA: Diagnosis not present

## 2023-09-01 DIAGNOSIS — C179 Malignant neoplasm of small intestine, unspecified: Secondary | ICD-10-CM | POA: Diagnosis not present

## 2023-09-01 DIAGNOSIS — C787 Secondary malignant neoplasm of liver and intrahepatic bile duct: Secondary | ICD-10-CM | POA: Diagnosis not present

## 2023-09-04 DIAGNOSIS — Z9889 Other specified postprocedural states: Secondary | ICD-10-CM | POA: Diagnosis not present

## 2023-09-04 DIAGNOSIS — Z87891 Personal history of nicotine dependence: Secondary | ICD-10-CM | POA: Diagnosis not present

## 2023-09-04 DIAGNOSIS — Z86711 Personal history of pulmonary embolism: Secondary | ICD-10-CM | POA: Diagnosis not present

## 2023-09-04 DIAGNOSIS — Z5112 Encounter for antineoplastic immunotherapy: Secondary | ICD-10-CM | POA: Diagnosis not present

## 2023-09-04 DIAGNOSIS — R188 Other ascites: Secondary | ICD-10-CM | POA: Diagnosis not present

## 2023-09-04 DIAGNOSIS — R238 Other skin changes: Secondary | ICD-10-CM | POA: Diagnosis not present

## 2023-09-04 DIAGNOSIS — R140 Abdominal distension (gaseous): Secondary | ICD-10-CM | POA: Diagnosis not present

## 2023-09-04 DIAGNOSIS — Z6822 Body mass index (BMI) 22.0-22.9, adult: Secondary | ICD-10-CM | POA: Diagnosis not present

## 2023-09-04 DIAGNOSIS — E871 Hypo-osmolality and hyponatremia: Secondary | ICD-10-CM | POA: Diagnosis not present

## 2023-09-04 DIAGNOSIS — G893 Neoplasm related pain (acute) (chronic): Secondary | ICD-10-CM | POA: Diagnosis not present

## 2023-09-04 DIAGNOSIS — C7951 Secondary malignant neoplasm of bone: Secondary | ICD-10-CM | POA: Diagnosis not present

## 2023-09-04 DIAGNOSIS — Z85820 Personal history of malignant melanoma of skin: Secondary | ICD-10-CM | POA: Diagnosis not present

## 2023-09-04 DIAGNOSIS — C787 Secondary malignant neoplasm of liver and intrahepatic bile duct: Secondary | ICD-10-CM | POA: Diagnosis not present

## 2023-09-04 DIAGNOSIS — Z7901 Long term (current) use of anticoagulants: Secondary | ICD-10-CM | POA: Diagnosis not present

## 2023-09-04 DIAGNOSIS — R634 Abnormal weight loss: Secondary | ICD-10-CM | POA: Diagnosis not present

## 2023-09-04 DIAGNOSIS — Z86006 Personal history of melanoma in-situ: Secondary | ICD-10-CM | POA: Diagnosis not present

## 2023-09-05 DIAGNOSIS — C787 Secondary malignant neoplasm of liver and intrahepatic bile duct: Secondary | ICD-10-CM | POA: Diagnosis not present

## 2023-09-05 DIAGNOSIS — R188 Other ascites: Secondary | ICD-10-CM | POA: Diagnosis not present

## 2023-09-06 DIAGNOSIS — R000 Tachycardia, unspecified: Secondary | ICD-10-CM | POA: Diagnosis not present

## 2023-09-06 DIAGNOSIS — R0682 Tachypnea, not elsewhere classified: Secondary | ICD-10-CM | POA: Diagnosis not present

## 2023-09-06 DIAGNOSIS — Z604 Social exclusion and rejection: Secondary | ICD-10-CM | POA: Diagnosis not present

## 2023-09-06 DIAGNOSIS — I10 Essential (primary) hypertension: Secondary | ICD-10-CM | POA: Diagnosis not present

## 2023-09-06 DIAGNOSIS — G893 Neoplasm related pain (acute) (chronic): Secondary | ICD-10-CM | POA: Diagnosis not present

## 2023-09-06 DIAGNOSIS — D72829 Elevated white blood cell count, unspecified: Secondary | ICD-10-CM | POA: Diagnosis not present

## 2023-09-06 DIAGNOSIS — C179 Malignant neoplasm of small intestine, unspecified: Secondary | ICD-10-CM | POA: Diagnosis not present

## 2023-09-06 DIAGNOSIS — M8458XD Pathological fracture in neoplastic disease, other specified site, subsequent encounter for fracture with routine healing: Secondary | ICD-10-CM | POA: Diagnosis not present

## 2023-09-06 DIAGNOSIS — Z7901 Long term (current) use of anticoagulants: Secondary | ICD-10-CM | POA: Diagnosis not present

## 2023-09-06 DIAGNOSIS — Z79891 Long term (current) use of opiate analgesic: Secondary | ICD-10-CM | POA: Diagnosis not present

## 2023-09-06 DIAGNOSIS — R5082 Postprocedural fever: Secondary | ICD-10-CM | POA: Diagnosis not present

## 2023-09-06 DIAGNOSIS — E785 Hyperlipidemia, unspecified: Secondary | ICD-10-CM | POA: Diagnosis not present

## 2023-09-06 DIAGNOSIS — C787 Secondary malignant neoplasm of liver and intrahepatic bile duct: Secondary | ICD-10-CM | POA: Diagnosis not present

## 2023-09-06 DIAGNOSIS — Z483 Aftercare following surgery for neoplasm: Secondary | ICD-10-CM | POA: Diagnosis not present

## 2023-09-06 DIAGNOSIS — C7951 Secondary malignant neoplasm of bone: Secondary | ICD-10-CM | POA: Diagnosis not present

## 2023-09-07 DIAGNOSIS — Z483 Aftercare following surgery for neoplasm: Secondary | ICD-10-CM | POA: Diagnosis not present

## 2023-09-07 DIAGNOSIS — D72829 Elevated white blood cell count, unspecified: Secondary | ICD-10-CM | POA: Diagnosis not present

## 2023-09-07 DIAGNOSIS — Z7901 Long term (current) use of anticoagulants: Secondary | ICD-10-CM | POA: Diagnosis not present

## 2023-09-07 DIAGNOSIS — M8458XD Pathological fracture in neoplastic disease, other specified site, subsequent encounter for fracture with routine healing: Secondary | ICD-10-CM | POA: Diagnosis not present

## 2023-09-07 DIAGNOSIS — Z604 Social exclusion and rejection: Secondary | ICD-10-CM | POA: Diagnosis not present

## 2023-09-07 DIAGNOSIS — Z79891 Long term (current) use of opiate analgesic: Secondary | ICD-10-CM | POA: Diagnosis not present

## 2023-09-07 DIAGNOSIS — G893 Neoplasm related pain (acute) (chronic): Secondary | ICD-10-CM | POA: Diagnosis not present

## 2023-09-07 DIAGNOSIS — C179 Malignant neoplasm of small intestine, unspecified: Secondary | ICD-10-CM | POA: Diagnosis not present

## 2023-09-07 DIAGNOSIS — R0682 Tachypnea, not elsewhere classified: Secondary | ICD-10-CM | POA: Diagnosis not present

## 2023-09-07 DIAGNOSIS — C787 Secondary malignant neoplasm of liver and intrahepatic bile duct: Secondary | ICD-10-CM | POA: Diagnosis not present

## 2023-09-07 DIAGNOSIS — E785 Hyperlipidemia, unspecified: Secondary | ICD-10-CM | POA: Diagnosis not present

## 2023-09-07 DIAGNOSIS — I10 Essential (primary) hypertension: Secondary | ICD-10-CM | POA: Diagnosis not present

## 2023-09-07 DIAGNOSIS — C7951 Secondary malignant neoplasm of bone: Secondary | ICD-10-CM | POA: Diagnosis not present

## 2023-09-07 DIAGNOSIS — R5082 Postprocedural fever: Secondary | ICD-10-CM | POA: Diagnosis not present

## 2023-09-07 DIAGNOSIS — R000 Tachycardia, unspecified: Secondary | ICD-10-CM | POA: Diagnosis not present

## 2023-09-08 DIAGNOSIS — R0682 Tachypnea, not elsewhere classified: Secondary | ICD-10-CM | POA: Diagnosis not present

## 2023-09-08 DIAGNOSIS — E46 Unspecified protein-calorie malnutrition: Secondary | ICD-10-CM | POA: Diagnosis not present

## 2023-09-08 DIAGNOSIS — G4733 Obstructive sleep apnea (adult) (pediatric): Secondary | ICD-10-CM | POA: Diagnosis not present

## 2023-09-08 DIAGNOSIS — Z483 Aftercare following surgery for neoplasm: Secondary | ICD-10-CM | POA: Diagnosis not present

## 2023-09-08 DIAGNOSIS — R5082 Postprocedural fever: Secondary | ICD-10-CM | POA: Diagnosis not present

## 2023-09-08 DIAGNOSIS — E785 Hyperlipidemia, unspecified: Secondary | ICD-10-CM | POA: Diagnosis not present

## 2023-09-08 DIAGNOSIS — E871 Hypo-osmolality and hyponatremia: Secondary | ICD-10-CM | POA: Diagnosis not present

## 2023-09-08 DIAGNOSIS — R509 Fever, unspecified: Secondary | ICD-10-CM | POA: Diagnosis not present

## 2023-09-08 DIAGNOSIS — I2699 Other pulmonary embolism without acute cor pulmonale: Secondary | ICD-10-CM | POA: Diagnosis not present

## 2023-09-08 DIAGNOSIS — Z923 Personal history of irradiation: Secondary | ICD-10-CM | POA: Diagnosis not present

## 2023-09-08 DIAGNOSIS — I959 Hypotension, unspecified: Secondary | ICD-10-CM | POA: Diagnosis not present

## 2023-09-08 DIAGNOSIS — I10 Essential (primary) hypertension: Secondary | ICD-10-CM | POA: Diagnosis not present

## 2023-09-08 DIAGNOSIS — M8458XD Pathological fracture in neoplastic disease, other specified site, subsequent encounter for fracture with routine healing: Secondary | ICD-10-CM | POA: Diagnosis not present

## 2023-09-08 DIAGNOSIS — Z79891 Long term (current) use of opiate analgesic: Secondary | ICD-10-CM | POA: Diagnosis not present

## 2023-09-08 DIAGNOSIS — Z515 Encounter for palliative care: Secondary | ICD-10-CM | POA: Diagnosis not present

## 2023-09-08 DIAGNOSIS — K5900 Constipation, unspecified: Secondary | ICD-10-CM | POA: Diagnosis not present

## 2023-09-08 DIAGNOSIS — Z7901 Long term (current) use of anticoagulants: Secondary | ICD-10-CM | POA: Diagnosis not present

## 2023-09-08 DIAGNOSIS — Z604 Social exclusion and rejection: Secondary | ICD-10-CM | POA: Diagnosis not present

## 2023-09-08 DIAGNOSIS — C787 Secondary malignant neoplasm of liver and intrahepatic bile duct: Secondary | ICD-10-CM | POA: Diagnosis not present

## 2023-09-08 DIAGNOSIS — R000 Tachycardia, unspecified: Secondary | ICD-10-CM | POA: Diagnosis not present

## 2023-09-08 DIAGNOSIS — F5102 Adjustment insomnia: Secondary | ICD-10-CM | POA: Diagnosis not present

## 2023-09-08 DIAGNOSIS — F419 Anxiety disorder, unspecified: Secondary | ICD-10-CM | POA: Diagnosis not present

## 2023-09-08 DIAGNOSIS — C179 Malignant neoplasm of small intestine, unspecified: Secondary | ICD-10-CM | POA: Diagnosis not present

## 2023-09-08 DIAGNOSIS — D72829 Elevated white blood cell count, unspecified: Secondary | ICD-10-CM | POA: Diagnosis not present

## 2023-09-08 DIAGNOSIS — C439 Malignant melanoma of skin, unspecified: Secondary | ICD-10-CM | POA: Diagnosis not present

## 2023-09-08 DIAGNOSIS — J90 Pleural effusion, not elsewhere classified: Secondary | ICD-10-CM | POA: Diagnosis not present

## 2023-09-08 DIAGNOSIS — G893 Neoplasm related pain (acute) (chronic): Secondary | ICD-10-CM | POA: Diagnosis not present

## 2023-09-08 DIAGNOSIS — F411 Generalized anxiety disorder: Secondary | ICD-10-CM | POA: Diagnosis not present

## 2023-09-08 DIAGNOSIS — E873 Alkalosis: Secondary | ICD-10-CM | POA: Diagnosis not present

## 2023-09-08 DIAGNOSIS — Z86711 Personal history of pulmonary embolism: Secondary | ICD-10-CM | POA: Diagnosis not present

## 2023-09-08 DIAGNOSIS — C7951 Secondary malignant neoplasm of bone: Secondary | ICD-10-CM | POA: Diagnosis not present

## 2023-09-08 DIAGNOSIS — R180 Malignant ascites: Secondary | ICD-10-CM | POA: Diagnosis not present

## 2023-09-08 DIAGNOSIS — R0989 Other specified symptoms and signs involving the circulatory and respiratory systems: Secondary | ICD-10-CM | POA: Diagnosis not present

## 2023-09-08 DIAGNOSIS — R630 Anorexia: Secondary | ICD-10-CM | POA: Diagnosis not present

## 2023-09-12 DIAGNOSIS — Z981 Arthrodesis status: Secondary | ICD-10-CM | POA: Diagnosis not present

## 2023-09-12 DIAGNOSIS — C7951 Secondary malignant neoplasm of bone: Secondary | ICD-10-CM | POA: Diagnosis not present

## 2023-09-12 DIAGNOSIS — Z719 Counseling, unspecified: Secondary | ICD-10-CM | POA: Diagnosis not present

## 2023-09-12 DIAGNOSIS — M8458XD Pathological fracture in neoplastic disease, other specified site, subsequent encounter for fracture with routine healing: Secondary | ICD-10-CM | POA: Diagnosis not present

## 2023-09-12 DIAGNOSIS — M4325 Fusion of spine, thoracolumbar region: Secondary | ICD-10-CM | POA: Diagnosis not present

## 2023-09-18 DIAGNOSIS — R262 Difficulty in walking, not elsewhere classified: Secondary | ICD-10-CM | POA: Diagnosis not present

## 2023-09-18 DIAGNOSIS — M5451 Vertebrogenic low back pain: Secondary | ICD-10-CM | POA: Diagnosis not present

## 2023-09-18 DIAGNOSIS — M4326 Fusion of spine, lumbar region: Secondary | ICD-10-CM | POA: Diagnosis not present

## 2023-09-20 DIAGNOSIS — R600 Localized edema: Secondary | ICD-10-CM | POA: Diagnosis not present

## 2023-09-20 DIAGNOSIS — Z79620 Long term (current) use of immunosuppressive biologic: Secondary | ICD-10-CM | POA: Diagnosis not present

## 2023-09-20 DIAGNOSIS — Z87891 Personal history of nicotine dependence: Secondary | ICD-10-CM | POA: Diagnosis not present

## 2023-09-20 DIAGNOSIS — R180 Malignant ascites: Secondary | ICD-10-CM | POA: Diagnosis not present

## 2023-09-20 DIAGNOSIS — C7951 Secondary malignant neoplasm of bone: Secondary | ICD-10-CM | POA: Diagnosis not present

## 2023-09-20 DIAGNOSIS — M7989 Other specified soft tissue disorders: Secondary | ICD-10-CM | POA: Diagnosis not present

## 2023-09-20 DIAGNOSIS — C787 Secondary malignant neoplasm of liver and intrahepatic bile duct: Secondary | ICD-10-CM | POA: Diagnosis not present

## 2023-09-20 DIAGNOSIS — C439 Malignant melanoma of skin, unspecified: Secondary | ICD-10-CM | POA: Diagnosis not present

## 2023-09-20 DIAGNOSIS — R000 Tachycardia, unspecified: Secondary | ICD-10-CM | POA: Diagnosis not present

## 2023-09-20 DIAGNOSIS — Z7901 Long term (current) use of anticoagulants: Secondary | ICD-10-CM | POA: Diagnosis not present

## 2023-09-20 DIAGNOSIS — Z86711 Personal history of pulmonary embolism: Secondary | ICD-10-CM | POA: Diagnosis not present

## 2023-09-20 DIAGNOSIS — M25471 Effusion, right ankle: Secondary | ICD-10-CM | POA: Diagnosis not present

## 2023-09-21 DIAGNOSIS — M5451 Vertebrogenic low back pain: Secondary | ICD-10-CM | POA: Diagnosis not present

## 2023-09-21 DIAGNOSIS — M4326 Fusion of spine, lumbar region: Secondary | ICD-10-CM | POA: Diagnosis not present

## 2023-09-25 DIAGNOSIS — Z5112 Encounter for antineoplastic immunotherapy: Secondary | ICD-10-CM | POA: Diagnosis not present

## 2023-09-25 DIAGNOSIS — R188 Other ascites: Secondary | ICD-10-CM | POA: Diagnosis not present

## 2023-09-25 DIAGNOSIS — E871 Hypo-osmolality and hyponatremia: Secondary | ICD-10-CM | POA: Diagnosis not present

## 2023-09-25 DIAGNOSIS — R634 Abnormal weight loss: Secondary | ICD-10-CM | POA: Diagnosis not present

## 2023-09-25 DIAGNOSIS — C784 Secondary malignant neoplasm of small intestine: Secondary | ICD-10-CM | POA: Diagnosis not present

## 2023-09-25 DIAGNOSIS — R180 Malignant ascites: Secondary | ICD-10-CM | POA: Diagnosis not present

## 2023-09-25 DIAGNOSIS — Z79899 Other long term (current) drug therapy: Secondary | ICD-10-CM | POA: Diagnosis not present

## 2023-09-25 DIAGNOSIS — C7951 Secondary malignant neoplasm of bone: Secondary | ICD-10-CM | POA: Diagnosis not present

## 2023-09-25 DIAGNOSIS — Z87891 Personal history of nicotine dependence: Secondary | ICD-10-CM | POA: Diagnosis not present

## 2023-09-25 DIAGNOSIS — Z86711 Personal history of pulmonary embolism: Secondary | ICD-10-CM | POA: Diagnosis not present

## 2023-09-25 DIAGNOSIS — Z6821 Body mass index (BMI) 21.0-21.9, adult: Secondary | ICD-10-CM | POA: Diagnosis not present

## 2023-09-25 DIAGNOSIS — Z7901 Long term (current) use of anticoagulants: Secondary | ICD-10-CM | POA: Diagnosis not present

## 2023-09-25 DIAGNOSIS — G893 Neoplasm related pain (acute) (chronic): Secondary | ICD-10-CM | POA: Diagnosis not present

## 2023-09-25 DIAGNOSIS — L818 Other specified disorders of pigmentation: Secondary | ICD-10-CM | POA: Diagnosis not present

## 2023-09-25 DIAGNOSIS — C787 Secondary malignant neoplasm of liver and intrahepatic bile duct: Secondary | ICD-10-CM | POA: Diagnosis not present

## 2023-09-25 DIAGNOSIS — K5900 Constipation, unspecified: Secondary | ICD-10-CM | POA: Diagnosis not present

## 2023-09-25 DIAGNOSIS — C439 Malignant melanoma of skin, unspecified: Secondary | ICD-10-CM | POA: Diagnosis not present

## 2023-09-25 DIAGNOSIS — E876 Hypokalemia: Secondary | ICD-10-CM | POA: Diagnosis not present

## 2023-09-26 DIAGNOSIS — M4326 Fusion of spine, lumbar region: Secondary | ICD-10-CM | POA: Diagnosis not present

## 2023-09-26 DIAGNOSIS — M5451 Vertebrogenic low back pain: Secondary | ICD-10-CM | POA: Diagnosis not present

## 2023-09-28 DIAGNOSIS — R262 Difficulty in walking, not elsewhere classified: Secondary | ICD-10-CM | POA: Diagnosis not present

## 2023-09-28 DIAGNOSIS — M5451 Vertebrogenic low back pain: Secondary | ICD-10-CM | POA: Diagnosis not present

## 2023-09-28 DIAGNOSIS — M4326 Fusion of spine, lumbar region: Secondary | ICD-10-CM | POA: Diagnosis not present

## 2023-10-03 DIAGNOSIS — M5451 Vertebrogenic low back pain: Secondary | ICD-10-CM | POA: Diagnosis not present

## 2023-10-03 DIAGNOSIS — R262 Difficulty in walking, not elsewhere classified: Secondary | ICD-10-CM | POA: Diagnosis not present

## 2023-10-03 DIAGNOSIS — M4326 Fusion of spine, lumbar region: Secondary | ICD-10-CM | POA: Diagnosis not present

## 2023-10-05 DIAGNOSIS — M5451 Vertebrogenic low back pain: Secondary | ICD-10-CM | POA: Diagnosis not present

## 2023-10-05 DIAGNOSIS — M4326 Fusion of spine, lumbar region: Secondary | ICD-10-CM | POA: Diagnosis not present

## 2023-10-05 DIAGNOSIS — R262 Difficulty in walking, not elsewhere classified: Secondary | ICD-10-CM | POA: Diagnosis not present

## 2023-10-06 DIAGNOSIS — G893 Neoplasm related pain (acute) (chronic): Secondary | ICD-10-CM | POA: Diagnosis not present

## 2023-10-06 DIAGNOSIS — Z515 Encounter for palliative care: Secondary | ICD-10-CM | POA: Diagnosis not present

## 2023-10-06 DIAGNOSIS — C7951 Secondary malignant neoplasm of bone: Secondary | ICD-10-CM | POA: Diagnosis not present

## 2023-10-06 DIAGNOSIS — G4700 Insomnia, unspecified: Secondary | ICD-10-CM | POA: Diagnosis not present

## 2023-10-08 DIAGNOSIS — E785 Hyperlipidemia, unspecified: Secondary | ICD-10-CM | POA: Diagnosis not present

## 2023-10-08 DIAGNOSIS — Z7901 Long term (current) use of anticoagulants: Secondary | ICD-10-CM | POA: Diagnosis not present

## 2023-10-08 DIAGNOSIS — R319 Hematuria, unspecified: Secondary | ICD-10-CM | POA: Diagnosis not present

## 2023-10-08 DIAGNOSIS — Z5181 Encounter for therapeutic drug level monitoring: Secondary | ICD-10-CM | POA: Diagnosis not present

## 2023-10-08 DIAGNOSIS — Z87891 Personal history of nicotine dependence: Secondary | ICD-10-CM | POA: Diagnosis not present

## 2023-10-08 DIAGNOSIS — I10 Essential (primary) hypertension: Secondary | ICD-10-CM | POA: Diagnosis not present

## 2023-10-08 DIAGNOSIS — S39012A Strain of muscle, fascia and tendon of lower back, initial encounter: Secondary | ICD-10-CM | POA: Diagnosis not present

## 2023-10-08 DIAGNOSIS — M549 Dorsalgia, unspecified: Secondary | ICD-10-CM | POA: Diagnosis not present

## 2023-10-08 DIAGNOSIS — X58XXXA Exposure to other specified factors, initial encounter: Secondary | ICD-10-CM | POA: Diagnosis not present

## 2023-10-08 DIAGNOSIS — R140 Abdominal distension (gaseous): Secondary | ICD-10-CM | POA: Diagnosis not present

## 2023-10-10 DIAGNOSIS — R180 Malignant ascites: Secondary | ICD-10-CM | POA: Diagnosis not present

## 2023-10-10 DIAGNOSIS — C439 Malignant melanoma of skin, unspecified: Secondary | ICD-10-CM | POA: Diagnosis not present

## 2023-10-10 DIAGNOSIS — C787 Secondary malignant neoplasm of liver and intrahepatic bile duct: Secondary | ICD-10-CM | POA: Diagnosis not present

## 2023-10-11 DIAGNOSIS — M4326 Fusion of spine, lumbar region: Secondary | ICD-10-CM | POA: Diagnosis not present

## 2023-10-11 DIAGNOSIS — R262 Difficulty in walking, not elsewhere classified: Secondary | ICD-10-CM | POA: Diagnosis not present

## 2023-10-11 DIAGNOSIS — M5451 Vertebrogenic low back pain: Secondary | ICD-10-CM | POA: Diagnosis not present

## 2023-10-13 DIAGNOSIS — R262 Difficulty in walking, not elsewhere classified: Secondary | ICD-10-CM | POA: Diagnosis not present

## 2023-10-13 DIAGNOSIS — M5451 Vertebrogenic low back pain: Secondary | ICD-10-CM | POA: Diagnosis not present

## 2023-10-13 DIAGNOSIS — M4326 Fusion of spine, lumbar region: Secondary | ICD-10-CM | POA: Diagnosis not present

## 2023-10-16 DIAGNOSIS — Z5112 Encounter for antineoplastic immunotherapy: Secondary | ICD-10-CM | POA: Diagnosis not present

## 2023-10-16 DIAGNOSIS — Z79899 Other long term (current) drug therapy: Secondary | ICD-10-CM | POA: Diagnosis not present

## 2023-10-16 DIAGNOSIS — G893 Neoplasm related pain (acute) (chronic): Secondary | ICD-10-CM | POA: Diagnosis not present

## 2023-10-16 DIAGNOSIS — R21 Rash and other nonspecific skin eruption: Secondary | ICD-10-CM | POA: Diagnosis not present

## 2023-10-16 DIAGNOSIS — M545 Low back pain, unspecified: Secondary | ICD-10-CM | POA: Diagnosis not present

## 2023-10-16 DIAGNOSIS — C7951 Secondary malignant neoplasm of bone: Secondary | ICD-10-CM | POA: Diagnosis not present

## 2023-10-16 DIAGNOSIS — Z7901 Long term (current) use of anticoagulants: Secondary | ICD-10-CM | POA: Diagnosis not present

## 2023-10-16 DIAGNOSIS — G479 Sleep disorder, unspecified: Secondary | ICD-10-CM | POA: Diagnosis not present

## 2023-10-16 DIAGNOSIS — R188 Other ascites: Secondary | ICD-10-CM | POA: Diagnosis not present

## 2023-10-16 DIAGNOSIS — C787 Secondary malignant neoplasm of liver and intrahepatic bile duct: Secondary | ICD-10-CM | POA: Diagnosis not present

## 2023-10-16 DIAGNOSIS — Z86006 Personal history of melanoma in-situ: Secondary | ICD-10-CM | POA: Diagnosis not present

## 2023-10-16 DIAGNOSIS — Z86711 Personal history of pulmonary embolism: Secondary | ICD-10-CM | POA: Diagnosis not present

## 2023-10-16 DIAGNOSIS — E876 Hypokalemia: Secondary | ICD-10-CM | POA: Diagnosis not present

## 2023-10-16 DIAGNOSIS — Z87891 Personal history of nicotine dependence: Secondary | ICD-10-CM | POA: Diagnosis not present

## 2023-10-17 DIAGNOSIS — M5451 Vertebrogenic low back pain: Secondary | ICD-10-CM | POA: Diagnosis not present

## 2023-10-17 DIAGNOSIS — M4326 Fusion of spine, lumbar region: Secondary | ICD-10-CM | POA: Diagnosis not present

## 2023-10-17 DIAGNOSIS — R262 Difficulty in walking, not elsewhere classified: Secondary | ICD-10-CM | POA: Diagnosis not present

## 2023-10-20 DIAGNOSIS — M4326 Fusion of spine, lumbar region: Secondary | ICD-10-CM | POA: Diagnosis not present

## 2023-10-20 DIAGNOSIS — R262 Difficulty in walking, not elsewhere classified: Secondary | ICD-10-CM | POA: Diagnosis not present

## 2023-10-20 DIAGNOSIS — M5451 Vertebrogenic low back pain: Secondary | ICD-10-CM | POA: Diagnosis not present

## 2023-10-24 DIAGNOSIS — M4326 Fusion of spine, lumbar region: Secondary | ICD-10-CM | POA: Diagnosis not present

## 2023-10-24 DIAGNOSIS — M5451 Vertebrogenic low back pain: Secondary | ICD-10-CM | POA: Diagnosis not present

## 2023-10-24 DIAGNOSIS — R262 Difficulty in walking, not elsewhere classified: Secondary | ICD-10-CM | POA: Diagnosis not present

## 2023-10-27 DIAGNOSIS — M4326 Fusion of spine, lumbar region: Secondary | ICD-10-CM | POA: Diagnosis not present

## 2023-10-27 DIAGNOSIS — M5451 Vertebrogenic low back pain: Secondary | ICD-10-CM | POA: Diagnosis not present

## 2023-10-27 DIAGNOSIS — R262 Difficulty in walking, not elsewhere classified: Secondary | ICD-10-CM | POA: Diagnosis not present

## 2023-10-31 DIAGNOSIS — M4326 Fusion of spine, lumbar region: Secondary | ICD-10-CM | POA: Diagnosis not present

## 2023-10-31 DIAGNOSIS — R262 Difficulty in walking, not elsewhere classified: Secondary | ICD-10-CM | POA: Diagnosis not present

## 2023-10-31 DIAGNOSIS — M5451 Vertebrogenic low back pain: Secondary | ICD-10-CM | POA: Diagnosis not present

## 2023-11-01 DIAGNOSIS — C7951 Secondary malignant neoplasm of bone: Secondary | ICD-10-CM | POA: Diagnosis not present

## 2023-11-01 DIAGNOSIS — G4700 Insomnia, unspecified: Secondary | ICD-10-CM | POA: Diagnosis not present

## 2023-11-01 DIAGNOSIS — Z515 Encounter for palliative care: Secondary | ICD-10-CM | POA: Diagnosis not present

## 2023-11-01 DIAGNOSIS — G893 Neoplasm related pain (acute) (chronic): Secondary | ICD-10-CM | POA: Diagnosis not present

## 2023-11-01 DIAGNOSIS — Z981 Arthrodesis status: Secondary | ICD-10-CM | POA: Diagnosis not present

## 2023-11-01 DIAGNOSIS — M8458XA Pathological fracture in neoplastic disease, other specified site, initial encounter for fracture: Secondary | ICD-10-CM | POA: Diagnosis not present

## 2023-11-02 DIAGNOSIS — M5451 Vertebrogenic low back pain: Secondary | ICD-10-CM | POA: Diagnosis not present

## 2023-11-02 DIAGNOSIS — R262 Difficulty in walking, not elsewhere classified: Secondary | ICD-10-CM | POA: Diagnosis not present

## 2023-11-02 DIAGNOSIS — M4326 Fusion of spine, lumbar region: Secondary | ICD-10-CM | POA: Diagnosis not present

## 2023-11-05 DIAGNOSIS — G4733 Obstructive sleep apnea (adult) (pediatric): Secondary | ICD-10-CM | POA: Diagnosis not present

## 2023-11-06 DIAGNOSIS — C787 Secondary malignant neoplasm of liver and intrahepatic bile duct: Secondary | ICD-10-CM | POA: Diagnosis not present

## 2023-11-06 DIAGNOSIS — R180 Malignant ascites: Secondary | ICD-10-CM | POA: Diagnosis not present

## 2023-11-07 DIAGNOSIS — Z7901 Long term (current) use of anticoagulants: Secondary | ICD-10-CM | POA: Diagnosis not present

## 2023-11-07 DIAGNOSIS — G893 Neoplasm related pain (acute) (chronic): Secondary | ICD-10-CM | POA: Diagnosis not present

## 2023-11-07 DIAGNOSIS — C7951 Secondary malignant neoplasm of bone: Secondary | ICD-10-CM | POA: Diagnosis not present

## 2023-11-07 DIAGNOSIS — C784 Secondary malignant neoplasm of small intestine: Secondary | ICD-10-CM | POA: Diagnosis not present

## 2023-11-07 DIAGNOSIS — L80 Vitiligo: Secondary | ICD-10-CM | POA: Diagnosis not present

## 2023-11-07 DIAGNOSIS — E876 Hypokalemia: Secondary | ICD-10-CM | POA: Diagnosis not present

## 2023-11-07 DIAGNOSIS — Z5112 Encounter for antineoplastic immunotherapy: Secondary | ICD-10-CM | POA: Diagnosis not present

## 2023-11-07 DIAGNOSIS — L299 Pruritus, unspecified: Secondary | ICD-10-CM | POA: Diagnosis not present

## 2023-11-07 DIAGNOSIS — Z79899 Other long term (current) drug therapy: Secondary | ICD-10-CM | POA: Diagnosis not present

## 2023-11-07 DIAGNOSIS — Z86711 Personal history of pulmonary embolism: Secondary | ICD-10-CM | POA: Diagnosis not present

## 2023-11-07 DIAGNOSIS — Z5111 Encounter for antineoplastic chemotherapy: Secondary | ICD-10-CM | POA: Diagnosis not present

## 2023-11-07 DIAGNOSIS — Z87891 Personal history of nicotine dependence: Secondary | ICD-10-CM | POA: Diagnosis not present

## 2023-11-07 DIAGNOSIS — Z6821 Body mass index (BMI) 21.0-21.9, adult: Secondary | ICD-10-CM | POA: Diagnosis not present

## 2023-11-07 DIAGNOSIS — R21 Rash and other nonspecific skin eruption: Secondary | ICD-10-CM | POA: Diagnosis not present

## 2023-11-07 DIAGNOSIS — E871 Hypo-osmolality and hyponatremia: Secondary | ICD-10-CM | POA: Diagnosis not present

## 2023-11-07 DIAGNOSIS — K5900 Constipation, unspecified: Secondary | ICD-10-CM | POA: Diagnosis not present

## 2023-11-07 DIAGNOSIS — R188 Other ascites: Secondary | ICD-10-CM | POA: Diagnosis not present

## 2023-11-07 DIAGNOSIS — C787 Secondary malignant neoplasm of liver and intrahepatic bile duct: Secondary | ICD-10-CM | POA: Diagnosis not present

## 2023-11-07 DIAGNOSIS — G479 Sleep disorder, unspecified: Secondary | ICD-10-CM | POA: Diagnosis not present

## 2023-11-07 DIAGNOSIS — R634 Abnormal weight loss: Secondary | ICD-10-CM | POA: Diagnosis not present

## 2023-11-07 DIAGNOSIS — C439 Malignant melanoma of skin, unspecified: Secondary | ICD-10-CM | POA: Diagnosis not present

## 2023-11-08 DIAGNOSIS — M5451 Vertebrogenic low back pain: Secondary | ICD-10-CM | POA: Diagnosis not present

## 2023-11-08 DIAGNOSIS — M4326 Fusion of spine, lumbar region: Secondary | ICD-10-CM | POA: Diagnosis not present

## 2023-11-08 DIAGNOSIS — R262 Difficulty in walking, not elsewhere classified: Secondary | ICD-10-CM | POA: Diagnosis not present

## 2023-11-09 DIAGNOSIS — G4733 Obstructive sleep apnea (adult) (pediatric): Secondary | ICD-10-CM | POA: Diagnosis not present

## 2023-11-13 DIAGNOSIS — C7951 Secondary malignant neoplasm of bone: Secondary | ICD-10-CM | POA: Diagnosis not present

## 2023-11-13 DIAGNOSIS — E7800 Pure hypercholesterolemia, unspecified: Secondary | ICD-10-CM | POA: Diagnosis not present

## 2023-11-13 DIAGNOSIS — Z0000 Encounter for general adult medical examination without abnormal findings: Secondary | ICD-10-CM | POA: Diagnosis not present

## 2023-11-13 DIAGNOSIS — I10 Essential (primary) hypertension: Secondary | ICD-10-CM | POA: Diagnosis not present

## 2023-11-14 DIAGNOSIS — M5451 Vertebrogenic low back pain: Secondary | ICD-10-CM | POA: Diagnosis not present

## 2023-11-14 DIAGNOSIS — R262 Difficulty in walking, not elsewhere classified: Secondary | ICD-10-CM | POA: Diagnosis not present

## 2023-11-14 DIAGNOSIS — M4326 Fusion of spine, lumbar region: Secondary | ICD-10-CM | POA: Diagnosis not present

## 2023-11-17 DIAGNOSIS — M4326 Fusion of spine, lumbar region: Secondary | ICD-10-CM | POA: Diagnosis not present

## 2023-11-17 DIAGNOSIS — R262 Difficulty in walking, not elsewhere classified: Secondary | ICD-10-CM | POA: Diagnosis not present

## 2023-11-17 DIAGNOSIS — M5451 Vertebrogenic low back pain: Secondary | ICD-10-CM | POA: Diagnosis not present

## 2023-11-21 DIAGNOSIS — M5451 Vertebrogenic low back pain: Secondary | ICD-10-CM | POA: Diagnosis not present

## 2023-11-21 DIAGNOSIS — R262 Difficulty in walking, not elsewhere classified: Secondary | ICD-10-CM | POA: Diagnosis not present

## 2023-11-21 DIAGNOSIS — M4326 Fusion of spine, lumbar region: Secondary | ICD-10-CM | POA: Diagnosis not present

## 2023-11-23 DIAGNOSIS — R262 Difficulty in walking, not elsewhere classified: Secondary | ICD-10-CM | POA: Diagnosis not present

## 2023-11-23 DIAGNOSIS — M4326 Fusion of spine, lumbar region: Secondary | ICD-10-CM | POA: Diagnosis not present

## 2023-11-23 DIAGNOSIS — M5451 Vertebrogenic low back pain: Secondary | ICD-10-CM | POA: Diagnosis not present

## 2023-11-28 DIAGNOSIS — C7951 Secondary malignant neoplasm of bone: Secondary | ICD-10-CM | POA: Diagnosis not present

## 2023-11-28 DIAGNOSIS — Z5111 Encounter for antineoplastic chemotherapy: Secondary | ICD-10-CM | POA: Diagnosis not present

## 2023-11-28 DIAGNOSIS — Z79899 Other long term (current) drug therapy: Secondary | ICD-10-CM | POA: Diagnosis not present

## 2023-11-28 DIAGNOSIS — C439 Malignant melanoma of skin, unspecified: Secondary | ICD-10-CM | POA: Diagnosis not present

## 2023-11-28 DIAGNOSIS — Z5112 Encounter for antineoplastic immunotherapy: Secondary | ICD-10-CM | POA: Diagnosis not present

## 2023-11-28 DIAGNOSIS — Z87891 Personal history of nicotine dependence: Secondary | ICD-10-CM | POA: Diagnosis not present

## 2023-11-28 DIAGNOSIS — R590 Localized enlarged lymph nodes: Secondary | ICD-10-CM | POA: Diagnosis not present

## 2023-11-28 DIAGNOSIS — Z85820 Personal history of malignant melanoma of skin: Secondary | ICD-10-CM | POA: Diagnosis not present

## 2023-11-28 DIAGNOSIS — R918 Other nonspecific abnormal finding of lung field: Secondary | ICD-10-CM | POA: Diagnosis not present

## 2023-11-28 DIAGNOSIS — C787 Secondary malignant neoplasm of liver and intrahepatic bile duct: Secondary | ICD-10-CM | POA: Diagnosis not present

## 2023-11-29 DIAGNOSIS — M5451 Vertebrogenic low back pain: Secondary | ICD-10-CM | POA: Diagnosis not present

## 2023-11-29 DIAGNOSIS — R262 Difficulty in walking, not elsewhere classified: Secondary | ICD-10-CM | POA: Diagnosis not present

## 2023-11-29 DIAGNOSIS — M4326 Fusion of spine, lumbar region: Secondary | ICD-10-CM | POA: Diagnosis not present

## 2023-12-01 DIAGNOSIS — M5451 Vertebrogenic low back pain: Secondary | ICD-10-CM | POA: Diagnosis not present

## 2023-12-01 DIAGNOSIS — M4326 Fusion of spine, lumbar region: Secondary | ICD-10-CM | POA: Diagnosis not present

## 2023-12-01 DIAGNOSIS — R262 Difficulty in walking, not elsewhere classified: Secondary | ICD-10-CM | POA: Diagnosis not present

## 2023-12-06 DIAGNOSIS — T45AX5D Adverse effect of immune checkpoint inhibitors and immunostimulant drugs, subsequent encounter: Secondary | ICD-10-CM | POA: Diagnosis not present

## 2023-12-06 DIAGNOSIS — Z08 Encounter for follow-up examination after completed treatment for malignant neoplasm: Secondary | ICD-10-CM | POA: Diagnosis not present

## 2023-12-06 DIAGNOSIS — R21 Rash and other nonspecific skin eruption: Secondary | ICD-10-CM | POA: Diagnosis not present

## 2023-12-06 DIAGNOSIS — Z9289 Personal history of other medical treatment: Secondary | ICD-10-CM | POA: Diagnosis not present

## 2023-12-06 DIAGNOSIS — Z85820 Personal history of malignant melanoma of skin: Secondary | ICD-10-CM | POA: Diagnosis not present

## 2023-12-06 DIAGNOSIS — B019 Varicella without complication: Secondary | ICD-10-CM | POA: Diagnosis not present

## 2023-12-06 DIAGNOSIS — B0089 Other herpesviral infection: Secondary | ICD-10-CM | POA: Diagnosis not present

## 2023-12-06 DIAGNOSIS — G4733 Obstructive sleep apnea (adult) (pediatric): Secondary | ICD-10-CM | POA: Diagnosis not present

## 2023-12-08 DIAGNOSIS — M5451 Vertebrogenic low back pain: Secondary | ICD-10-CM | POA: Diagnosis not present

## 2023-12-08 DIAGNOSIS — R262 Difficulty in walking, not elsewhere classified: Secondary | ICD-10-CM | POA: Diagnosis not present

## 2023-12-08 DIAGNOSIS — M4326 Fusion of spine, lumbar region: Secondary | ICD-10-CM | POA: Diagnosis not present

## 2023-12-10 DIAGNOSIS — G4733 Obstructive sleep apnea (adult) (pediatric): Secondary | ICD-10-CM | POA: Diagnosis not present

## 2023-12-12 DIAGNOSIS — M4326 Fusion of spine, lumbar region: Secondary | ICD-10-CM | POA: Diagnosis not present

## 2023-12-12 DIAGNOSIS — R262 Difficulty in walking, not elsewhere classified: Secondary | ICD-10-CM | POA: Diagnosis not present

## 2023-12-12 DIAGNOSIS — M5451 Vertebrogenic low back pain: Secondary | ICD-10-CM | POA: Diagnosis not present

## 2023-12-13 DIAGNOSIS — M5451 Vertebrogenic low back pain: Secondary | ICD-10-CM | POA: Diagnosis not present

## 2023-12-13 DIAGNOSIS — M4326 Fusion of spine, lumbar region: Secondary | ICD-10-CM | POA: Diagnosis not present

## 2023-12-13 DIAGNOSIS — R262 Difficulty in walking, not elsewhere classified: Secondary | ICD-10-CM | POA: Diagnosis not present

## 2023-12-19 DIAGNOSIS — C7952 Secondary malignant neoplasm of bone marrow: Secondary | ICD-10-CM | POA: Diagnosis not present

## 2023-12-19 DIAGNOSIS — M5451 Vertebrogenic low back pain: Secondary | ICD-10-CM | POA: Diagnosis not present

## 2023-12-19 DIAGNOSIS — C7951 Secondary malignant neoplasm of bone: Secondary | ICD-10-CM | POA: Diagnosis not present

## 2023-12-19 DIAGNOSIS — R262 Difficulty in walking, not elsewhere classified: Secondary | ICD-10-CM | POA: Diagnosis not present

## 2023-12-19 DIAGNOSIS — C801 Malignant (primary) neoplasm, unspecified: Secondary | ICD-10-CM | POA: Diagnosis not present

## 2023-12-19 DIAGNOSIS — M4326 Fusion of spine, lumbar region: Secondary | ICD-10-CM | POA: Diagnosis not present

## 2023-12-20 DIAGNOSIS — C7951 Secondary malignant neoplasm of bone: Secondary | ICD-10-CM | POA: Diagnosis not present

## 2023-12-20 DIAGNOSIS — G4700 Insomnia, unspecified: Secondary | ICD-10-CM | POA: Diagnosis not present

## 2023-12-22 DIAGNOSIS — M4326 Fusion of spine, lumbar region: Secondary | ICD-10-CM | POA: Diagnosis not present

## 2023-12-22 DIAGNOSIS — R262 Difficulty in walking, not elsewhere classified: Secondary | ICD-10-CM | POA: Diagnosis not present

## 2023-12-22 DIAGNOSIS — M5451 Vertebrogenic low back pain: Secondary | ICD-10-CM | POA: Diagnosis not present

## 2023-12-25 DIAGNOSIS — C787 Secondary malignant neoplasm of liver and intrahepatic bile duct: Secondary | ICD-10-CM | POA: Diagnosis not present

## 2023-12-25 DIAGNOSIS — Z79899 Other long term (current) drug therapy: Secondary | ICD-10-CM | POA: Diagnosis not present

## 2023-12-25 DIAGNOSIS — Z9289 Personal history of other medical treatment: Secondary | ICD-10-CM | POA: Diagnosis not present

## 2023-12-25 DIAGNOSIS — C439 Malignant melanoma of skin, unspecified: Secondary | ICD-10-CM | POA: Diagnosis not present

## 2023-12-25 DIAGNOSIS — Z5112 Encounter for antineoplastic immunotherapy: Secondary | ICD-10-CM | POA: Diagnosis not present

## 2023-12-25 DIAGNOSIS — Z87891 Personal history of nicotine dependence: Secondary | ICD-10-CM | POA: Diagnosis not present

## 2023-12-25 DIAGNOSIS — C7951 Secondary malignant neoplasm of bone: Secondary | ICD-10-CM | POA: Diagnosis not present

## 2023-12-27 DIAGNOSIS — D485 Neoplasm of uncertain behavior of skin: Secondary | ICD-10-CM | POA: Diagnosis not present

## 2023-12-27 DIAGNOSIS — M5451 Vertebrogenic low back pain: Secondary | ICD-10-CM | POA: Diagnosis not present

## 2023-12-27 DIAGNOSIS — Z85820 Personal history of malignant melanoma of skin: Secondary | ICD-10-CM | POA: Diagnosis not present

## 2023-12-27 DIAGNOSIS — Z08 Encounter for follow-up examination after completed treatment for malignant neoplasm: Secondary | ICD-10-CM | POA: Diagnosis not present

## 2023-12-27 DIAGNOSIS — L820 Inflamed seborrheic keratosis: Secondary | ICD-10-CM | POA: Diagnosis not present

## 2023-12-27 DIAGNOSIS — M4326 Fusion of spine, lumbar region: Secondary | ICD-10-CM | POA: Diagnosis not present

## 2023-12-27 DIAGNOSIS — D225 Melanocytic nevi of trunk: Secondary | ICD-10-CM | POA: Diagnosis not present

## 2023-12-27 DIAGNOSIS — Z1283 Encounter for screening for malignant neoplasm of skin: Secondary | ICD-10-CM | POA: Diagnosis not present

## 2023-12-27 DIAGNOSIS — R262 Difficulty in walking, not elsewhere classified: Secondary | ICD-10-CM | POA: Diagnosis not present

## 2023-12-29 DIAGNOSIS — M4326 Fusion of spine, lumbar region: Secondary | ICD-10-CM | POA: Diagnosis not present

## 2023-12-29 DIAGNOSIS — R262 Difficulty in walking, not elsewhere classified: Secondary | ICD-10-CM | POA: Diagnosis not present

## 2023-12-29 DIAGNOSIS — M5451 Vertebrogenic low back pain: Secondary | ICD-10-CM | POA: Diagnosis not present

## 2024-01-04 DIAGNOSIS — R262 Difficulty in walking, not elsewhere classified: Secondary | ICD-10-CM | POA: Diagnosis not present

## 2024-01-04 DIAGNOSIS — M4326 Fusion of spine, lumbar region: Secondary | ICD-10-CM | POA: Diagnosis not present

## 2024-01-04 DIAGNOSIS — M5451 Vertebrogenic low back pain: Secondary | ICD-10-CM | POA: Diagnosis not present

## 2024-01-09 DIAGNOSIS — G4733 Obstructive sleep apnea (adult) (pediatric): Secondary | ICD-10-CM | POA: Diagnosis not present

## 2024-01-10 DIAGNOSIS — M4326 Fusion of spine, lumbar region: Secondary | ICD-10-CM | POA: Diagnosis not present

## 2024-01-10 DIAGNOSIS — M5451 Vertebrogenic low back pain: Secondary | ICD-10-CM | POA: Diagnosis not present

## 2024-01-10 DIAGNOSIS — R262 Difficulty in walking, not elsewhere classified: Secondary | ICD-10-CM | POA: Diagnosis not present

## 2024-01-12 DIAGNOSIS — R262 Difficulty in walking, not elsewhere classified: Secondary | ICD-10-CM | POA: Diagnosis not present

## 2024-01-12 DIAGNOSIS — M4326 Fusion of spine, lumbar region: Secondary | ICD-10-CM | POA: Diagnosis not present

## 2024-01-12 DIAGNOSIS — M5451 Vertebrogenic low back pain: Secondary | ICD-10-CM | POA: Diagnosis not present

## 2024-01-17 DIAGNOSIS — M5451 Vertebrogenic low back pain: Secondary | ICD-10-CM | POA: Diagnosis not present

## 2024-01-17 DIAGNOSIS — M4326 Fusion of spine, lumbar region: Secondary | ICD-10-CM | POA: Diagnosis not present

## 2024-01-17 DIAGNOSIS — R262 Difficulty in walking, not elsewhere classified: Secondary | ICD-10-CM | POA: Diagnosis not present

## 2024-01-19 DIAGNOSIS — M5451 Vertebrogenic low back pain: Secondary | ICD-10-CM | POA: Diagnosis not present

## 2024-01-19 DIAGNOSIS — M4326 Fusion of spine, lumbar region: Secondary | ICD-10-CM | POA: Diagnosis not present

## 2024-01-19 DIAGNOSIS — R262 Difficulty in walking, not elsewhere classified: Secondary | ICD-10-CM | POA: Diagnosis not present

## 2024-01-22 DIAGNOSIS — K56699 Other intestinal obstruction unspecified as to partial versus complete obstruction: Secondary | ICD-10-CM | POA: Diagnosis not present

## 2024-01-22 DIAGNOSIS — C784 Secondary malignant neoplasm of small intestine: Secondary | ICD-10-CM | POA: Diagnosis not present

## 2024-01-22 DIAGNOSIS — R200 Anesthesia of skin: Secondary | ICD-10-CM | POA: Diagnosis not present

## 2024-01-22 DIAGNOSIS — L80 Vitiligo: Secondary | ICD-10-CM | POA: Diagnosis not present

## 2024-01-22 DIAGNOSIS — M549 Dorsalgia, unspecified: Secondary | ICD-10-CM | POA: Diagnosis not present

## 2024-01-22 DIAGNOSIS — G893 Neoplasm related pain (acute) (chronic): Secondary | ICD-10-CM | POA: Diagnosis not present

## 2024-01-22 DIAGNOSIS — I81 Portal vein thrombosis: Secondary | ICD-10-CM | POA: Diagnosis not present

## 2024-01-22 DIAGNOSIS — D630 Anemia in neoplastic disease: Secondary | ICD-10-CM | POA: Diagnosis not present

## 2024-01-22 DIAGNOSIS — C439 Malignant melanoma of skin, unspecified: Secondary | ICD-10-CM | POA: Diagnosis not present

## 2024-01-22 DIAGNOSIS — K561 Intussusception: Secondary | ICD-10-CM | POA: Diagnosis not present

## 2024-01-22 DIAGNOSIS — Z7901 Long term (current) use of anticoagulants: Secondary | ICD-10-CM | POA: Diagnosis not present

## 2024-01-22 DIAGNOSIS — C7951 Secondary malignant neoplasm of bone: Secondary | ICD-10-CM | POA: Diagnosis not present

## 2024-01-22 DIAGNOSIS — Z86711 Personal history of pulmonary embolism: Secondary | ICD-10-CM | POA: Diagnosis not present

## 2024-01-22 DIAGNOSIS — R21 Rash and other nonspecific skin eruption: Secondary | ICD-10-CM | POA: Diagnosis not present

## 2024-01-22 DIAGNOSIS — Z87891 Personal history of nicotine dependence: Secondary | ICD-10-CM | POA: Diagnosis not present

## 2024-01-22 DIAGNOSIS — C787 Secondary malignant neoplasm of liver and intrahepatic bile duct: Secondary | ICD-10-CM | POA: Diagnosis not present

## 2024-01-22 DIAGNOSIS — Z5112 Encounter for antineoplastic immunotherapy: Secondary | ICD-10-CM | POA: Diagnosis not present

## 2024-01-22 DIAGNOSIS — Z923 Personal history of irradiation: Secondary | ICD-10-CM | POA: Diagnosis not present

## 2024-01-22 DIAGNOSIS — R188 Other ascites: Secondary | ICD-10-CM | POA: Diagnosis not present

## 2024-01-22 DIAGNOSIS — Z8619 Personal history of other infectious and parasitic diseases: Secondary | ICD-10-CM | POA: Diagnosis not present

## 2024-01-22 DIAGNOSIS — E876 Hypokalemia: Secondary | ICD-10-CM | POA: Diagnosis not present

## 2024-01-22 DIAGNOSIS — Z79899 Other long term (current) drug therapy: Secondary | ICD-10-CM | POA: Diagnosis not present

## 2024-01-24 DIAGNOSIS — M4326 Fusion of spine, lumbar region: Secondary | ICD-10-CM | POA: Diagnosis not present

## 2024-01-24 DIAGNOSIS — M5451 Vertebrogenic low back pain: Secondary | ICD-10-CM | POA: Diagnosis not present

## 2024-01-24 DIAGNOSIS — R262 Difficulty in walking, not elsewhere classified: Secondary | ICD-10-CM | POA: Diagnosis not present

## 2024-01-26 DIAGNOSIS — R262 Difficulty in walking, not elsewhere classified: Secondary | ICD-10-CM | POA: Diagnosis not present

## 2024-01-26 DIAGNOSIS — M4326 Fusion of spine, lumbar region: Secondary | ICD-10-CM | POA: Diagnosis not present

## 2024-01-26 DIAGNOSIS — M5451 Vertebrogenic low back pain: Secondary | ICD-10-CM | POA: Diagnosis not present

## 2024-01-31 DIAGNOSIS — M4326 Fusion of spine, lumbar region: Secondary | ICD-10-CM | POA: Diagnosis not present

## 2024-01-31 DIAGNOSIS — M5451 Vertebrogenic low back pain: Secondary | ICD-10-CM | POA: Diagnosis not present

## 2024-01-31 DIAGNOSIS — R262 Difficulty in walking, not elsewhere classified: Secondary | ICD-10-CM | POA: Diagnosis not present

## 2024-02-02 DIAGNOSIS — R262 Difficulty in walking, not elsewhere classified: Secondary | ICD-10-CM | POA: Diagnosis not present

## 2024-02-02 DIAGNOSIS — M4326 Fusion of spine, lumbar region: Secondary | ICD-10-CM | POA: Diagnosis not present

## 2024-02-02 DIAGNOSIS — M5451 Vertebrogenic low back pain: Secondary | ICD-10-CM | POA: Diagnosis not present

## 2024-02-06 DIAGNOSIS — R262 Difficulty in walking, not elsewhere classified: Secondary | ICD-10-CM | POA: Diagnosis not present

## 2024-02-06 DIAGNOSIS — M4326 Fusion of spine, lumbar region: Secondary | ICD-10-CM | POA: Diagnosis not present

## 2024-02-06 DIAGNOSIS — M5451 Vertebrogenic low back pain: Secondary | ICD-10-CM | POA: Diagnosis not present

## 2024-02-08 DIAGNOSIS — M5451 Vertebrogenic low back pain: Secondary | ICD-10-CM | POA: Diagnosis not present

## 2024-02-08 DIAGNOSIS — R262 Difficulty in walking, not elsewhere classified: Secondary | ICD-10-CM | POA: Diagnosis not present

## 2024-02-08 DIAGNOSIS — M4326 Fusion of spine, lumbar region: Secondary | ICD-10-CM | POA: Diagnosis not present

## 2024-02-09 DIAGNOSIS — G4733 Obstructive sleep apnea (adult) (pediatric): Secondary | ICD-10-CM | POA: Diagnosis not present

## 2024-02-16 DIAGNOSIS — C787 Secondary malignant neoplasm of liver and intrahepatic bile duct: Secondary | ICD-10-CM | POA: Diagnosis not present

## 2024-02-16 DIAGNOSIS — C439 Malignant melanoma of skin, unspecified: Secondary | ICD-10-CM | POA: Diagnosis not present

## 2024-02-16 DIAGNOSIS — R911 Solitary pulmonary nodule: Secondary | ICD-10-CM | POA: Diagnosis not present

## 2024-02-19 DIAGNOSIS — Z79899 Other long term (current) drug therapy: Secondary | ICD-10-CM | POA: Diagnosis not present

## 2024-02-19 DIAGNOSIS — L80 Vitiligo: Secondary | ICD-10-CM | POA: Diagnosis not present

## 2024-02-19 DIAGNOSIS — Z87891 Personal history of nicotine dependence: Secondary | ICD-10-CM | POA: Diagnosis not present

## 2024-02-19 DIAGNOSIS — D630 Anemia in neoplastic disease: Secondary | ICD-10-CM | POA: Diagnosis not present

## 2024-02-19 DIAGNOSIS — C439 Malignant melanoma of skin, unspecified: Secondary | ICD-10-CM | POA: Diagnosis not present

## 2024-02-19 DIAGNOSIS — C787 Secondary malignant neoplasm of liver and intrahepatic bile duct: Secondary | ICD-10-CM | POA: Diagnosis not present

## 2024-02-19 DIAGNOSIS — I2699 Other pulmonary embolism without acute cor pulmonale: Secondary | ICD-10-CM | POA: Diagnosis not present

## 2024-02-19 DIAGNOSIS — R5383 Other fatigue: Secondary | ICD-10-CM | POA: Diagnosis not present

## 2024-02-19 DIAGNOSIS — C7951 Secondary malignant neoplasm of bone: Secondary | ICD-10-CM | POA: Diagnosis not present

## 2024-02-19 DIAGNOSIS — C784 Secondary malignant neoplasm of small intestine: Secondary | ICD-10-CM | POA: Diagnosis not present

## 2024-02-19 DIAGNOSIS — R21 Rash and other nonspecific skin eruption: Secondary | ICD-10-CM | POA: Diagnosis not present

## 2024-02-19 DIAGNOSIS — Z5112 Encounter for antineoplastic immunotherapy: Secondary | ICD-10-CM | POA: Diagnosis not present

## 2024-02-19 DIAGNOSIS — G893 Neoplasm related pain (acute) (chronic): Secondary | ICD-10-CM | POA: Diagnosis not present

## 2024-02-19 DIAGNOSIS — E876 Hypokalemia: Secondary | ICD-10-CM | POA: Diagnosis not present

## 2024-02-19 DIAGNOSIS — Z6824 Body mass index (BMI) 24.0-24.9, adult: Secondary | ICD-10-CM | POA: Diagnosis not present

## 2024-02-19 DIAGNOSIS — K561 Intussusception: Secondary | ICD-10-CM | POA: Diagnosis not present

## 2024-02-19 DIAGNOSIS — R634 Abnormal weight loss: Secondary | ICD-10-CM | POA: Diagnosis not present

## 2024-02-19 DIAGNOSIS — C499 Malignant neoplasm of connective and soft tissue, unspecified: Secondary | ICD-10-CM | POA: Diagnosis not present

## 2024-02-19 DIAGNOSIS — I81 Portal vein thrombosis: Secondary | ICD-10-CM | POA: Diagnosis not present

## 2024-02-20 DIAGNOSIS — M4326 Fusion of spine, lumbar region: Secondary | ICD-10-CM | POA: Diagnosis not present

## 2024-02-20 DIAGNOSIS — M5451 Vertebrogenic low back pain: Secondary | ICD-10-CM | POA: Diagnosis not present

## 2024-02-20 DIAGNOSIS — R262 Difficulty in walking, not elsewhere classified: Secondary | ICD-10-CM | POA: Diagnosis not present

## 2024-02-23 DIAGNOSIS — M5451 Vertebrogenic low back pain: Secondary | ICD-10-CM | POA: Diagnosis not present

## 2024-02-23 DIAGNOSIS — R262 Difficulty in walking, not elsewhere classified: Secondary | ICD-10-CM | POA: Diagnosis not present

## 2024-02-23 DIAGNOSIS — M4326 Fusion of spine, lumbar region: Secondary | ICD-10-CM | POA: Diagnosis not present

## 2024-02-27 DIAGNOSIS — M545 Low back pain, unspecified: Secondary | ICD-10-CM | POA: Diagnosis not present

## 2024-02-29 DIAGNOSIS — M545 Low back pain, unspecified: Secondary | ICD-10-CM | POA: Diagnosis not present

## 2024-03-06 DIAGNOSIS — M545 Low back pain, unspecified: Secondary | ICD-10-CM | POA: Diagnosis not present

## 2024-03-07 DIAGNOSIS — M160 Bilateral primary osteoarthritis of hip: Secondary | ICD-10-CM | POA: Diagnosis not present

## 2024-03-07 DIAGNOSIS — M51369 Other intervertebral disc degeneration, lumbar region without mention of lumbar back pain or lower extremity pain: Secondary | ICD-10-CM | POA: Diagnosis not present

## 2024-03-07 DIAGNOSIS — M5134 Other intervertebral disc degeneration, thoracic region: Secondary | ICD-10-CM | POA: Diagnosis not present

## 2024-03-07 DIAGNOSIS — Z981 Arthrodesis status: Secondary | ICD-10-CM | POA: Diagnosis not present

## 2024-03-07 DIAGNOSIS — M5030 Other cervical disc degeneration, unspecified cervical region: Secondary | ICD-10-CM | POA: Diagnosis not present

## 2024-03-07 DIAGNOSIS — M21161 Varus deformity, not elsewhere classified, right knee: Secondary | ICD-10-CM | POA: Diagnosis not present

## 2024-03-07 DIAGNOSIS — M21162 Varus deformity, not elsewhere classified, left knee: Secondary | ICD-10-CM | POA: Diagnosis not present

## 2024-03-07 DIAGNOSIS — C7951 Secondary malignant neoplasm of bone: Secondary | ICD-10-CM | POA: Diagnosis not present

## 2024-03-08 DIAGNOSIS — M545 Low back pain, unspecified: Secondary | ICD-10-CM | POA: Diagnosis not present

## 2024-03-11 DIAGNOSIS — G4733 Obstructive sleep apnea (adult) (pediatric): Secondary | ICD-10-CM | POA: Diagnosis not present

## 2024-03-13 DIAGNOSIS — M545 Low back pain, unspecified: Secondary | ICD-10-CM | POA: Diagnosis not present

## 2024-03-15 DIAGNOSIS — M545 Low back pain, unspecified: Secondary | ICD-10-CM | POA: Diagnosis not present

## 2024-03-18 DIAGNOSIS — Z87891 Personal history of nicotine dependence: Secondary | ICD-10-CM | POA: Diagnosis not present

## 2024-03-18 DIAGNOSIS — K561 Intussusception: Secondary | ICD-10-CM | POA: Diagnosis not present

## 2024-03-18 DIAGNOSIS — I2699 Other pulmonary embolism without acute cor pulmonale: Secondary | ICD-10-CM | POA: Diagnosis not present

## 2024-03-18 DIAGNOSIS — D630 Anemia in neoplastic disease: Secondary | ICD-10-CM | POA: Diagnosis not present

## 2024-03-18 DIAGNOSIS — C7951 Secondary malignant neoplasm of bone: Secondary | ICD-10-CM | POA: Diagnosis not present

## 2024-03-18 DIAGNOSIS — Z86006 Personal history of melanoma in-situ: Secondary | ICD-10-CM | POA: Diagnosis not present

## 2024-03-18 DIAGNOSIS — R5383 Other fatigue: Secondary | ICD-10-CM | POA: Diagnosis not present

## 2024-03-18 DIAGNOSIS — C772 Secondary and unspecified malignant neoplasm of intra-abdominal lymph nodes: Secondary | ICD-10-CM | POA: Diagnosis not present

## 2024-03-18 DIAGNOSIS — E876 Hypokalemia: Secondary | ICD-10-CM | POA: Diagnosis not present

## 2024-03-18 DIAGNOSIS — Z79899 Other long term (current) drug therapy: Secondary | ICD-10-CM | POA: Diagnosis not present

## 2024-03-18 DIAGNOSIS — C787 Secondary malignant neoplasm of liver and intrahepatic bile duct: Secondary | ICD-10-CM | POA: Diagnosis not present

## 2024-03-18 DIAGNOSIS — I81 Portal vein thrombosis: Secondary | ICD-10-CM | POA: Diagnosis not present

## 2024-03-18 DIAGNOSIS — Z981 Arthrodesis status: Secondary | ICD-10-CM | POA: Diagnosis not present

## 2024-03-18 DIAGNOSIS — Z7901 Long term (current) use of anticoagulants: Secondary | ICD-10-CM | POA: Diagnosis not present

## 2024-03-18 DIAGNOSIS — J9811 Atelectasis: Secondary | ICD-10-CM | POA: Diagnosis not present

## 2024-03-18 DIAGNOSIS — G893 Neoplasm related pain (acute) (chronic): Secondary | ICD-10-CM | POA: Diagnosis not present

## 2024-03-18 DIAGNOSIS — K56699 Other intestinal obstruction unspecified as to partial versus complete obstruction: Secondary | ICD-10-CM | POA: Diagnosis not present

## 2024-03-18 DIAGNOSIS — Z5112 Encounter for antineoplastic immunotherapy: Secondary | ICD-10-CM | POA: Diagnosis not present

## 2024-03-18 DIAGNOSIS — Z23 Encounter for immunization: Secondary | ICD-10-CM | POA: Diagnosis not present

## 2024-03-18 DIAGNOSIS — R197 Diarrhea, unspecified: Secondary | ICD-10-CM | POA: Diagnosis not present

## 2024-03-25 DIAGNOSIS — M545 Low back pain, unspecified: Secondary | ICD-10-CM | POA: Diagnosis not present

## 2024-03-26 DIAGNOSIS — Z08 Encounter for follow-up examination after completed treatment for malignant neoplasm: Secondary | ICD-10-CM | POA: Diagnosis not present

## 2024-03-26 DIAGNOSIS — Z85820 Personal history of malignant melanoma of skin: Secondary | ICD-10-CM | POA: Diagnosis not present

## 2024-03-26 DIAGNOSIS — Z1283 Encounter for screening for malignant neoplasm of skin: Secondary | ICD-10-CM | POA: Diagnosis not present

## 2024-03-26 DIAGNOSIS — D485 Neoplasm of uncertain behavior of skin: Secondary | ICD-10-CM | POA: Diagnosis not present

## 2024-03-26 DIAGNOSIS — D225 Melanocytic nevi of trunk: Secondary | ICD-10-CM | POA: Diagnosis not present

## 2024-03-26 DIAGNOSIS — D235 Other benign neoplasm of skin of trunk: Secondary | ICD-10-CM | POA: Diagnosis not present

## 2024-03-27 DIAGNOSIS — M545 Low back pain, unspecified: Secondary | ICD-10-CM | POA: Diagnosis not present

## 2024-04-02 DIAGNOSIS — G4733 Obstructive sleep apnea (adult) (pediatric): Secondary | ICD-10-CM | POA: Diagnosis not present

## 2024-04-02 DIAGNOSIS — G4700 Insomnia, unspecified: Secondary | ICD-10-CM | POA: Diagnosis not present

## 2024-04-03 DIAGNOSIS — M545 Low back pain, unspecified: Secondary | ICD-10-CM | POA: Diagnosis not present

## 2024-04-05 DIAGNOSIS — M545 Low back pain, unspecified: Secondary | ICD-10-CM | POA: Diagnosis not present

## 2024-04-10 DIAGNOSIS — M545 Low back pain, unspecified: Secondary | ICD-10-CM | POA: Diagnosis not present

## 2024-04-15 DIAGNOSIS — C7951 Secondary malignant neoplasm of bone: Secondary | ICD-10-CM | POA: Diagnosis not present

## 2024-04-15 DIAGNOSIS — L89159 Pressure ulcer of sacral region, unspecified stage: Secondary | ICD-10-CM | POA: Diagnosis not present

## 2024-04-15 DIAGNOSIS — Z86711 Personal history of pulmonary embolism: Secondary | ICD-10-CM | POA: Diagnosis not present

## 2024-04-15 DIAGNOSIS — Z5112 Encounter for antineoplastic immunotherapy: Secondary | ICD-10-CM | POA: Diagnosis not present

## 2024-04-15 DIAGNOSIS — L80 Vitiligo: Secondary | ICD-10-CM | POA: Diagnosis not present

## 2024-04-15 DIAGNOSIS — M549 Dorsalgia, unspecified: Secondary | ICD-10-CM | POA: Diagnosis not present

## 2024-04-15 DIAGNOSIS — Z7901 Long term (current) use of anticoagulants: Secondary | ICD-10-CM | POA: Diagnosis not present

## 2024-04-15 DIAGNOSIS — D630 Anemia in neoplastic disease: Secondary | ICD-10-CM | POA: Diagnosis not present

## 2024-04-15 DIAGNOSIS — Z86006 Personal history of melanoma in-situ: Secondary | ICD-10-CM | POA: Diagnosis not present

## 2024-04-15 DIAGNOSIS — Z79899 Other long term (current) drug therapy: Secondary | ICD-10-CM | POA: Diagnosis not present

## 2024-04-15 DIAGNOSIS — C787 Secondary malignant neoplasm of liver and intrahepatic bile duct: Secondary | ICD-10-CM | POA: Diagnosis not present

## 2024-04-15 DIAGNOSIS — Z87891 Personal history of nicotine dependence: Secondary | ICD-10-CM | POA: Diagnosis not present

## 2024-04-15 DIAGNOSIS — E876 Hypokalemia: Secondary | ICD-10-CM | POA: Diagnosis not present

## 2024-04-15 DIAGNOSIS — G893 Neoplasm related pain (acute) (chronic): Secondary | ICD-10-CM | POA: Diagnosis not present

## 2024-04-15 DIAGNOSIS — R197 Diarrhea, unspecified: Secondary | ICD-10-CM | POA: Diagnosis not present

## 2024-04-17 ENCOUNTER — Other Ambulatory Visit: Payer: Self-pay | Admitting: Internal Medicine

## 2024-04-17 DIAGNOSIS — M545 Low back pain, unspecified: Secondary | ICD-10-CM | POA: Diagnosis not present

## 2024-04-19 DIAGNOSIS — M545 Low back pain, unspecified: Secondary | ICD-10-CM | POA: Diagnosis not present

## 2024-04-24 DIAGNOSIS — M545 Low back pain, unspecified: Secondary | ICD-10-CM | POA: Diagnosis not present

## 2024-04-25 DIAGNOSIS — M545 Low back pain, unspecified: Secondary | ICD-10-CM | POA: Diagnosis not present

## 2024-04-29 DIAGNOSIS — M545 Low back pain, unspecified: Secondary | ICD-10-CM | POA: Diagnosis not present

## 2024-05-01 DIAGNOSIS — M545 Low back pain, unspecified: Secondary | ICD-10-CM | POA: Diagnosis not present

## 2024-05-07 DIAGNOSIS — M545 Low back pain, unspecified: Secondary | ICD-10-CM | POA: Diagnosis not present

## 2024-05-09 DIAGNOSIS — M545 Low back pain, unspecified: Secondary | ICD-10-CM | POA: Diagnosis not present

## 2024-05-14 DIAGNOSIS — Z7901 Long term (current) use of anticoagulants: Secondary | ICD-10-CM | POA: Diagnosis not present

## 2024-05-14 DIAGNOSIS — C772 Secondary and unspecified malignant neoplasm of intra-abdominal lymph nodes: Secondary | ICD-10-CM | POA: Diagnosis not present

## 2024-05-14 DIAGNOSIS — I2699 Other pulmonary embolism without acute cor pulmonale: Secondary | ICD-10-CM | POA: Diagnosis not present

## 2024-05-14 DIAGNOSIS — R590 Localized enlarged lymph nodes: Secondary | ICD-10-CM | POA: Diagnosis not present

## 2024-05-14 DIAGNOSIS — C439 Malignant melanoma of skin, unspecified: Secondary | ICD-10-CM | POA: Diagnosis not present

## 2024-05-14 DIAGNOSIS — Z79899 Other long term (current) drug therapy: Secondary | ICD-10-CM | POA: Diagnosis not present

## 2024-05-14 DIAGNOSIS — G893 Neoplasm related pain (acute) (chronic): Secondary | ICD-10-CM | POA: Diagnosis not present

## 2024-05-14 DIAGNOSIS — R160 Hepatomegaly, not elsewhere classified: Secondary | ICD-10-CM | POA: Diagnosis not present

## 2024-05-14 DIAGNOSIS — R530 Neoplastic (malignant) related fatigue: Secondary | ICD-10-CM | POA: Diagnosis not present

## 2024-05-14 DIAGNOSIS — Z86718 Personal history of other venous thrombosis and embolism: Secondary | ICD-10-CM | POA: Diagnosis not present

## 2024-05-14 DIAGNOSIS — Z5112 Encounter for antineoplastic immunotherapy: Secondary | ICD-10-CM | POA: Diagnosis not present

## 2024-05-14 DIAGNOSIS — C787 Secondary malignant neoplasm of liver and intrahepatic bile duct: Secondary | ICD-10-CM | POA: Diagnosis not present

## 2024-05-14 DIAGNOSIS — Z86006 Personal history of melanoma in-situ: Secondary | ICD-10-CM | POA: Diagnosis not present

## 2024-05-14 DIAGNOSIS — Z87891 Personal history of nicotine dependence: Secondary | ICD-10-CM | POA: Diagnosis not present

## 2024-05-14 DIAGNOSIS — C7951 Secondary malignant neoplasm of bone: Secondary | ICD-10-CM | POA: Diagnosis not present

## 2024-05-15 DIAGNOSIS — M545 Low back pain, unspecified: Secondary | ICD-10-CM | POA: Diagnosis not present

## 2024-05-17 DIAGNOSIS — M545 Low back pain, unspecified: Secondary | ICD-10-CM | POA: Diagnosis not present

## 2024-05-24 DIAGNOSIS — G4733 Obstructive sleep apnea (adult) (pediatric): Secondary | ICD-10-CM | POA: Diagnosis not present

## 2024-05-24 DIAGNOSIS — M545 Low back pain, unspecified: Secondary | ICD-10-CM | POA: Diagnosis not present

## 2024-05-25 ENCOUNTER — Other Ambulatory Visit: Payer: Self-pay | Admitting: Internal Medicine

## 2024-05-27 DIAGNOSIS — M545 Low back pain, unspecified: Secondary | ICD-10-CM | POA: Diagnosis not present

## 2024-06-03 DIAGNOSIS — M545 Low back pain, unspecified: Secondary | ICD-10-CM | POA: Diagnosis not present

## 2024-06-09 ENCOUNTER — Other Ambulatory Visit: Payer: Self-pay | Admitting: Internal Medicine

## 2024-06-10 DIAGNOSIS — G893 Neoplasm related pain (acute) (chronic): Secondary | ICD-10-CM | POA: Diagnosis not present

## 2024-06-10 DIAGNOSIS — C179 Malignant neoplasm of small intestine, unspecified: Secondary | ICD-10-CM | POA: Diagnosis not present

## 2024-06-10 DIAGNOSIS — R530 Neoplastic (malignant) related fatigue: Secondary | ICD-10-CM | POA: Diagnosis not present

## 2024-06-10 DIAGNOSIS — Z5112 Encounter for antineoplastic immunotherapy: Secondary | ICD-10-CM | POA: Diagnosis not present

## 2024-06-10 DIAGNOSIS — E785 Hyperlipidemia, unspecified: Secondary | ICD-10-CM | POA: Diagnosis not present

## 2024-06-10 DIAGNOSIS — D630 Anemia in neoplastic disease: Secondary | ICD-10-CM | POA: Diagnosis not present

## 2024-06-10 DIAGNOSIS — R197 Diarrhea, unspecified: Secondary | ICD-10-CM | POA: Diagnosis not present

## 2024-06-10 DIAGNOSIS — C7951 Secondary malignant neoplasm of bone: Secondary | ICD-10-CM | POA: Diagnosis not present

## 2024-06-10 DIAGNOSIS — Z79899 Other long term (current) drug therapy: Secondary | ICD-10-CM | POA: Diagnosis not present

## 2024-06-10 DIAGNOSIS — I2699 Other pulmonary embolism without acute cor pulmonale: Secondary | ICD-10-CM | POA: Diagnosis not present

## 2024-06-10 DIAGNOSIS — E876 Hypokalemia: Secondary | ICD-10-CM | POA: Diagnosis not present

## 2024-06-10 DIAGNOSIS — Z7901 Long term (current) use of anticoagulants: Secondary | ICD-10-CM | POA: Diagnosis not present

## 2024-06-10 DIAGNOSIS — C787 Secondary malignant neoplasm of liver and intrahepatic bile duct: Secondary | ICD-10-CM | POA: Diagnosis not present

## 2024-06-11 DIAGNOSIS — M545 Low back pain, unspecified: Secondary | ICD-10-CM | POA: Diagnosis not present

## 2024-06-13 DIAGNOSIS — C7951 Secondary malignant neoplasm of bone: Secondary | ICD-10-CM | POA: Diagnosis not present

## 2024-06-13 DIAGNOSIS — Z515 Encounter for palliative care: Secondary | ICD-10-CM | POA: Diagnosis not present

## 2024-06-13 DIAGNOSIS — G4700 Insomnia, unspecified: Secondary | ICD-10-CM | POA: Diagnosis not present

## 2024-06-13 NOTE — Telephone Encounter (Signed)
 Defer to PCP - follow-up with me as needed

## 2024-06-19 DIAGNOSIS — H1031 Unspecified acute conjunctivitis, right eye: Secondary | ICD-10-CM | POA: Diagnosis not present

## 2024-06-19 DIAGNOSIS — Z981 Arthrodesis status: Secondary | ICD-10-CM | POA: Diagnosis not present

## 2024-06-19 DIAGNOSIS — C7951 Secondary malignant neoplasm of bone: Secondary | ICD-10-CM | POA: Diagnosis not present

## 2024-06-23 DIAGNOSIS — G4733 Obstructive sleep apnea (adult) (pediatric): Secondary | ICD-10-CM | POA: Diagnosis not present

## 2024-06-24 DIAGNOSIS — M545 Low back pain, unspecified: Secondary | ICD-10-CM | POA: Diagnosis not present

## 2024-07-01 DIAGNOSIS — M545 Low back pain, unspecified: Secondary | ICD-10-CM | POA: Diagnosis not present
